# Patient Record
Sex: Female | Born: 1955 | Race: White | Hispanic: No | Marital: Single | Smoking: Current every day smoker
Health system: Southern US, Community
[De-identification: ages and names within clinical notes are randomized; demographics above are authoritative.]

## PROBLEM LIST (undated history)

## (undated) DIAGNOSIS — Z8669 Personal history of other diseases of the nervous system and sense organs: Secondary | ICD-10-CM

## (undated) DIAGNOSIS — F32A Depression, unspecified: Secondary | ICD-10-CM

## (undated) DIAGNOSIS — M6281 Muscle weakness (generalized): Secondary | ICD-10-CM

## (undated) DIAGNOSIS — R0902 Hypoxemia: Secondary | ICD-10-CM

## (undated) DIAGNOSIS — R296 Repeated falls: Secondary | ICD-10-CM

## (undated) DIAGNOSIS — F17200 Nicotine dependence, unspecified, uncomplicated: Secondary | ICD-10-CM

## (undated) DIAGNOSIS — M1990 Unspecified osteoarthritis, unspecified site: Secondary | ICD-10-CM

## (undated) DIAGNOSIS — E669 Obesity, unspecified: Secondary | ICD-10-CM

## (undated) DIAGNOSIS — I10 Essential (primary) hypertension: Secondary | ICD-10-CM

## (undated) DIAGNOSIS — S72041A Displaced fracture of base of neck of right femur, initial encounter for closed fracture: Secondary | ICD-10-CM

## (undated) DIAGNOSIS — E119 Type 2 diabetes mellitus without complications: Secondary | ICD-10-CM

## (undated) DIAGNOSIS — J449 Chronic obstructive pulmonary disease, unspecified: Secondary | ICD-10-CM

## (undated) DIAGNOSIS — W19XXXA Unspecified fall, initial encounter: Secondary | ICD-10-CM

## (undated) DIAGNOSIS — Z9181 History of falling: Secondary | ICD-10-CM

## (undated) DIAGNOSIS — R569 Unspecified convulsions: Secondary | ICD-10-CM

## (undated) HISTORY — DX: Repeated falls: R29.6

## (undated) HISTORY — DX: Unspecified fall, initial encounter: W19.XXXA

## (undated) HISTORY — DX: Unspecified osteoarthritis, unspecified site: M19.90

## (undated) HISTORY — DX: Hypoxemia: R09.02

## (undated) HISTORY — PX: TONSILLECTOMY: SUR1361

## (undated) HISTORY — DX: Personal history of other diseases of the nervous system and sense organs: Z86.69

## (undated) HISTORY — DX: Unspecified convulsions: R56.9

## (undated) HISTORY — DX: Essential (primary) hypertension: I10

## (undated) HISTORY — DX: Depression, unspecified: F32.A

## (undated) HISTORY — DX: Chronic obstructive pulmonary disease, unspecified: J44.9

---

## 2010-06-18 HISTORY — PX: COLON SURGERY: SHX602

## 2010-06-18 HISTORY — PX: HIP SURGERY: SHX245

## 2010-06-18 HISTORY — PX: FRACTURE SURGERY: SHX138

## 2011-02-17 DIAGNOSIS — C189 Malignant neoplasm of colon, unspecified: Secondary | ICD-10-CM

## 2011-02-17 HISTORY — DX: Malignant neoplasm of colon, unspecified: C18.9

## 2016-02-08 HISTORY — PX: COLONOSCOPY: SHX174

## 2016-03-28 ENCOUNTER — Inpatient Hospital Stay (HOSPITAL_COMMUNITY)
Admission: EM | Admit: 2016-03-28 | Discharge: 2016-04-02 | DRG: 536 | Disposition: A | Discharge status: Skilled Nursing Facility | Payer: Medicaid Other | Attending: Internal Medicine | Admitting: Internal Medicine

## 2016-03-28 ENCOUNTER — Emergency Department (HOSPITAL_COMMUNITY): Payer: Medicaid Other

## 2016-03-28 ENCOUNTER — Encounter (HOSPITAL_COMMUNITY): Payer: Self-pay

## 2016-03-28 DIAGNOSIS — Z23 Encounter for immunization: Secondary | ICD-10-CM

## 2016-03-28 DIAGNOSIS — E1169 Type 2 diabetes mellitus with other specified complication: Secondary | ICD-10-CM | POA: Diagnosis present

## 2016-03-28 DIAGNOSIS — S72031A Displaced midcervical fracture of right femur, initial encounter for closed fracture: Principal | ICD-10-CM | POA: Diagnosis present

## 2016-03-28 DIAGNOSIS — Y92000 Kitchen of unspecified non-institutional (private) residence as  the place of occurrence of the external cause: Secondary | ICD-10-CM

## 2016-03-28 DIAGNOSIS — E669 Obesity, unspecified: Secondary | ICD-10-CM | POA: Diagnosis present

## 2016-03-28 DIAGNOSIS — Z6832 Body mass index (BMI) 32.0-32.9, adult: Secondary | ICD-10-CM

## 2016-03-28 DIAGNOSIS — D72829 Elevated white blood cell count, unspecified: Secondary | ICD-10-CM | POA: Diagnosis present

## 2016-03-28 DIAGNOSIS — W010XXA Fall on same level from slipping, tripping and stumbling without subsequent striking against object, initial encounter: Secondary | ICD-10-CM | POA: Diagnosis present

## 2016-03-28 DIAGNOSIS — E119 Type 2 diabetes mellitus without complications: Secondary | ICD-10-CM | POA: Diagnosis present

## 2016-03-28 DIAGNOSIS — F17210 Nicotine dependence, cigarettes, uncomplicated: Secondary | ICD-10-CM | POA: Diagnosis present

## 2016-03-28 DIAGNOSIS — Z833 Family history of diabetes mellitus: Secondary | ICD-10-CM

## 2016-03-28 DIAGNOSIS — S72009A Fracture of unspecified part of neck of unspecified femur, initial encounter for closed fracture: Secondary | ICD-10-CM | POA: Diagnosis present

## 2016-03-28 DIAGNOSIS — R112 Nausea with vomiting, unspecified: Secondary | ICD-10-CM

## 2016-03-28 DIAGNOSIS — S72001A Fracture of unspecified part of neck of right femur, initial encounter for closed fracture: Secondary | ICD-10-CM

## 2016-03-28 HISTORY — DX: Type 2 diabetes mellitus without complications: E11.9

## 2016-03-28 NOTE — ED Provider Notes (Signed)
[facility] DEPT Provider Note   CSN: [id number] Arrival date & time: 03/28/16  2118     History   Chief Complaint Chief Complaint  Patient presents with  . Fall    HPI Danielle Sullivan is a 60 y.o. female.  Patient presents to the emergency department with chief complaint of right hip pain. She states that she lost her balance this afternoon and fell in her kitchen. She complains of constant severe right hip pain. She reports a history of right hip fracture and surgery.  She states that she thinks the surgery was in [place] by a Dr. Lyndel Safe.  She denies any numbness or tingling. She states that her right lower extremity is painful with movement and palpation. She was given 50 g of fentanyl by EMS. States that her pain has improved. There are no other associated symptoms.    The history is provided by the patient. No language interpreter was used.    Past Medical History:  Diagnosis Date  . Diabetes mellitus without complication ([place])     There are no active problems to display for this patient.   Past Surgical History:  Procedure Laterality Date  . FRACTURE SURGERY      OB History    No data available       Home Medications    Prior to Admission medications   Medication Sig Start Date End Date Taking? Authorizing Provider  Ascorbic Acid (VITA-C PO) Take 1 tablet by mouth daily.   Yes Historical Provider, MD  MAGNESIUM PO Take 1 tablet by mouth daily.   Yes Historical Provider, MD  Multiple Minerals-Vitamins (NUTRA-SUPPORT BONE) CAPS Take 1 tablet by mouth daily.   Yes Historical Provider, MD  multivitamin-lutein (OCUVITE-LUTEIN) CAPS capsule Take 1 capsule by mouth daily.   Yes Historical Provider, MD  VITAMIN A PO Take 1 tablet by mouth daily.   Yes Historical Provider, MD  VITAMIN E PO Take 1 tablet by mouth daily.   Yes Historical Provider, MD    Family History History reviewed. No pertinent family history.  Social History Social History  Substance Use  Topics  . Smoking status: Current Every Day Smoker    Packs/day: 1.00    Types: Cigarettes  . Smokeless tobacco: Not on file  . Alcohol use No     Allergies   Pine   Review of Systems Review of Systems  Musculoskeletal: Positive for arthralgias.  All other systems reviewed and are negative.    Physical Exam Updated Vital Signs BP 143/76 (BP Location: Right Arm)   Pulse 88   Temp 98.3 F (36.8 C) (Oral)   Resp 14   Ht 5\' 1"  (1.549 m)   Wt 77.1 kg   SpO2 97%   BMI 32.12 kg/m   Physical Exam Physical Exam  Constitutional: Pt appears well-developed and well-nourished. No distress.  HENT:  Head: Normocephalic and atraumatic.  Eyes: Conjunctivae are normal.  Neck: Normal range of motion.  Cardiovascular: Normal rate, regular rhythm and intact distal pulses.   Capillary refill < 3 sec  Pulmonary/Chest: Effort normal and breath sounds normal.  Musculoskeletal:  Right hip: Pt exhibits tenderness to palpation of right hip, with obvious shortening and external rotation. Pt exhibits no edema.  ROM: limited 2/2 pain  Neurological: Pt  is alert. Coordination normal.  Sensation 5/5 Strength limited 2/2 pain  Skin: Skin is warm and dry. Pt is not diaphoretic.  No tenting of the skin  Psychiatric: Pt has a normal mood and affect.  Nursing note and vitals reviewed.   ED Treatments / Results  Labs (all labs ordered are listed, but only abnormal results are displayed) Labs Reviewed - No data to display  EKG  EKG Interpretation None       Radiology Dg Hip Unilat  With Pelvis 2-3 Views Right  Result Date: 03/28/2016 CLINICAL DATA:  Status post fall with right hip pain. EXAM: DG HIP (WITH OR WITHOUT PELVIS) 2-3V RIGHT COMPARISON:  None. FINDINGS: There is a right femoral nail and proximal compression screw. There is a fracture of the compression screw and transcervical fracture of the cervical neck with mild superior displacement of the femoral shaft in coxa vera  angulation. Addition, there is a irregularity within the left superior pubic ramus which may represent a nondisplaced fracture. No other fracture is identified. IMPRESSION: Right proximal femur transcervical neck fracture involving bone and compression screw with mild superior displacement of the femoral shaft and coxa vera angulation. Possible nondisplaced fracture of left superior pubic ramus. Electronically Signed   By: Kristine Garbe M.D.   On: 03/28/2016 22:58    Procedures Procedures (including critical care time)  Medications Ordered in ED Medications - No data to display   Initial Impression / Assessment and Plan / ED Course  I have reviewed the triage vital signs and the nursing notes.  Pertinent labs & imaging results that were available during my care of the patient were reviewed by me and considered in my medical decision making (see chart for details).  Clinical Course    Patient with right hip pain.  Plain films as above.  Will consult orthopedics.   Discussed the patient's case with Dr. Ninfa Linden, who recommends medicine admission. Dr. Ninfa Linden states that he is concerned that the hardware fracture is not new, but could be chronic.  Agrees that the pubic ramus fracture could be what is keeping the patient from walking.  He states that either way it will be a very complex surgery, and the patient will not have surgery tonight.  Appreciate Dr. Hal Hope for admitting the patient. He requests that I order a urinalysis and EKG for preoperative workup.  Final Clinical Impressions(s) / ED Diagnoses   Final diagnoses:  Closed fracture of right hip, initial encounter [facility])    New Prescriptions New Prescriptions   No medications on file     Montine Circle, PA-C 03/29/16 [place], DO 03/29/16 0330

## 2016-03-28 NOTE — ED Triage Notes (Signed)
Per [facility], pt from home. Pt fell in the kitchen around 1600 this afternoon. Pt did hit head but no LOC. Pt has small abrasion on right shoulder. Pt has shortening and rotation of right leg. Good pedal pulse and sensation and has movement of toes. Pt given 15mcg of fentanyl in route, pain now a 7/10 but spo2 dropped to 88%. VSS. Pt alert and oriented x4. CBG 296, pt is insulin dependent diabetic. NSR.

## 2016-03-29 ENCOUNTER — Encounter (HOSPITAL_COMMUNITY): Payer: Self-pay | Admitting: Internal Medicine

## 2016-03-29 ENCOUNTER — Emergency Department (HOSPITAL_COMMUNITY): Payer: Medicaid Other

## 2016-03-29 DIAGNOSIS — S72031A Displaced midcervical fracture of right femur, initial encounter for closed fracture: Secondary | ICD-10-CM | POA: Diagnosis not present

## 2016-03-29 DIAGNOSIS — W010XXA Fall on same level from slipping, tripping and stumbling without subsequent striking against object, initial encounter: Secondary | ICD-10-CM | POA: Diagnosis present

## 2016-03-29 DIAGNOSIS — Z6832 Body mass index (BMI) 32.0-32.9, adult: Secondary | ICD-10-CM | POA: Diagnosis not present

## 2016-03-29 DIAGNOSIS — Z833 Family history of diabetes mellitus: Secondary | ICD-10-CM | POA: Diagnosis not present

## 2016-03-29 DIAGNOSIS — S72009A Fracture of unspecified part of neck of unspecified femur, initial encounter for closed fracture: Secondary | ICD-10-CM | POA: Diagnosis present

## 2016-03-29 DIAGNOSIS — E669 Obesity, unspecified: Secondary | ICD-10-CM

## 2016-03-29 DIAGNOSIS — S72041A Displaced fracture of base of neck of right femur, initial encounter for closed fracture: Secondary | ICD-10-CM | POA: Diagnosis not present

## 2016-03-29 DIAGNOSIS — S72001A Fracture of unspecified part of neck of right femur, initial encounter for closed fracture: Secondary | ICD-10-CM

## 2016-03-29 DIAGNOSIS — E119 Type 2 diabetes mellitus without complications: Secondary | ICD-10-CM | POA: Diagnosis present

## 2016-03-29 DIAGNOSIS — D72829 Elevated white blood cell count, unspecified: Secondary | ICD-10-CM | POA: Diagnosis present

## 2016-03-29 DIAGNOSIS — Y92000 Kitchen of unspecified non-institutional (private) residence as  the place of occurrence of the external cause: Secondary | ICD-10-CM | POA: Diagnosis not present

## 2016-03-29 DIAGNOSIS — Z23 Encounter for immunization: Secondary | ICD-10-CM | POA: Diagnosis not present

## 2016-03-29 DIAGNOSIS — E1169 Type 2 diabetes mellitus with other specified complication: Secondary | ICD-10-CM | POA: Diagnosis not present

## 2016-03-29 DIAGNOSIS — F17210 Nicotine dependence, cigarettes, uncomplicated: Secondary | ICD-10-CM | POA: Diagnosis present

## 2016-03-29 DIAGNOSIS — M25551 Pain in right hip: Secondary | ICD-10-CM | POA: Diagnosis not present

## 2016-03-29 LAB — CBC
HCT: 41.4 % (ref 36.0–46.0)
Hemoglobin: 13.4 g/dL (ref 12.0–15.0)
MCH: 29.5 pg (ref 26.0–34.0)
MCHC: 32.4 g/dL (ref 30.0–36.0)
MCV: 91.2 fL (ref 78.0–100.0)
PLATELETS: 338 10*3/uL (ref 150–400)
RBC: 4.54 MIL/uL (ref 3.87–5.11)
RDW: 13.7 % (ref 11.5–15.5)
WBC: 16.2 10*3/uL — ABNORMAL HIGH (ref 4.0–10.5)

## 2016-03-29 LAB — BASIC METABOLIC PANEL
ANION GAP: 9 (ref 5–15)
Anion gap: 9 (ref 5–15)
BUN: 19 mg/dL (ref 6–20)
BUN: 16 mg/dL (ref 6–20)
CALCIUM: 9.4 mg/dL (ref 8.9–10.3)
CALCIUM: 9 mg/dL (ref 8.9–10.3)
CO2: 27 mmol/L (ref 22–32)
CO2: 26 mmol/L (ref 22–32)
CREATININE: 0.67 mg/dL (ref 0.44–1.00)
CREATININE: 0.57 mg/dL (ref 0.44–1.00)
Chloride: 100 mmol/L — ABNORMAL LOW (ref 101–111)
Chloride: 101 mmol/L (ref 101–111)
GFR calc Af Amer: >60 mL/min (ref >60)
GFR calc Af Amer: >60 mL/min (ref >60)
GFR calc non Af Amer: >60 mL/min (ref >60)
GLUCOSE: 194 mg/dL — AB (ref 65–99)
GLUCOSE: 155 mg/dL — AB (ref 65–99)
POTASSIUM: 4 mmol/L (ref 3.5–5.1)
Potassium: 5 mmol/L (ref 3.5–5.1)
SODIUM: 136 mmol/L (ref 135–145)
Sodium: 136 mmol/L (ref 135–145)

## 2016-03-29 LAB — CBC WITH DIFFERENTIAL/PLATELET
Basophils Absolute: 0 10*3/uL (ref 0.0–0.1)
Basophils Relative: 0 %
Eosinophils Absolute: 0 10*3/uL (ref 0.0–0.7)
Eosinophils Relative: 0 %
HEMATOCRIT: 43.2 % (ref 36.0–46.0)
Hemoglobin: 14.3 g/dL (ref 12.0–15.0)
LYMPHS PCT: 10 %
Lymphs Abs: 1.8 10*3/uL (ref 0.7–4.0)
MCH: 30.2 pg (ref 26.0–34.0)
MCHC: 33.1 g/dL (ref 30.0–36.0)
MCV: 91.3 fL (ref 78.0–100.0)
MONO ABS: 0.8 10*3/uL (ref 0.1–1.0)
MONOS PCT: 4 %
NEUTROS ABS: 15.4 10*3/uL — AB (ref 1.7–7.7)
Neutrophils Relative %: 86 %
Platelets: 324 10*3/uL (ref 150–400)
RBC: 4.73 MIL/uL (ref 3.87–5.11)
RDW: 13.6 % (ref 11.5–15.5)
WBC: 18 10*3/uL — ABNORMAL HIGH (ref 4.0–10.5)

## 2016-03-29 LAB — URINE MICROSCOPIC-ADD ON: WBC UA: NONE SEEN WBC/hpf (ref 0–5)

## 2016-03-29 LAB — URINALYSIS, ROUTINE W REFLEX MICROSCOPIC
Bilirubin Urine: NEGATIVE
GLUCOSE, UA: 250 mg/dL — AB
KETONES UR: NEGATIVE mg/dL
LEUKOCYTES UA: NEGATIVE
Nitrite: NEGATIVE
PH: 5.5 (ref 5.0–8.0)
Protein, ur: NEGATIVE mg/dL
SPECIFIC GRAVITY, URINE: 1.022 (ref 1.005–1.030)

## 2016-03-29 LAB — GLUCOSE, CAPILLARY
GLUCOSE-CAPILLARY: 166 mg/dL — AB (ref 65–99)
Glucose-Capillary: 236 mg/dL — ABNORMAL HIGH (ref 65–99)
Glucose-Capillary: 210 mg/dL — ABNORMAL HIGH (ref 65–99)
Glucose-Capillary: 191 mg/dL — ABNORMAL HIGH (ref 65–99)

## 2016-03-29 LAB — ABO/RH: ABO/RH(D): B POS

## 2016-03-29 LAB — TYPE AND SCREEN
ABO/RH(D): B POS
ANTIBODY SCREEN: NEGATIVE

## 2016-03-29 MED ORDER — INSULIN GLARGINE 100 UNIT/ML ~~LOC~~ SOLN
10.0000 [IU] | Freq: Every day | SUBCUTANEOUS | Status: DC
  Administered 2016-03-30 – 2016-04-01 (×3): 10 [IU] via SUBCUTANEOUS
  Filled 2016-03-29 (×4): qty 0.1

## 2016-03-29 MED ORDER — INSULIN GLARGINE 100 UNIT/ML ~~LOC~~ SOLN
16.0000 [IU] | Freq: Every day | SUBCUTANEOUS | Status: DC
  Filled 2016-03-29 (×2): qty 0.16

## 2016-03-29 MED ORDER — POLYETHYLENE GLYCOL 3350 17 G PO PACK
17.0000 g | PACK | Freq: Every day | ORAL | Status: DC
  Administered 2016-03-29 – 2016-04-02 (×5): 17 g via ORAL
  Filled 2016-03-29 (×5): qty 1

## 2016-03-29 MED ORDER — SENNA 8.6 MG PO TABS
1.0000 | ORAL_TABLET | Freq: Every day | ORAL | Status: DC
  Administered 2016-03-29 – 2016-04-02 (×5): 8.6 mg via ORAL
  Filled 2016-03-29 (×5): qty 1

## 2016-03-29 MED ORDER — FENTANYL CITRATE (PF) 100 MCG/2ML IJ SOLN
50.0000 ug | Freq: Once | INTRAMUSCULAR | Status: AC
  Administered 2016-03-29: 50 ug via INTRAVENOUS
  Filled 2016-03-29: qty 2

## 2016-03-29 MED ORDER — INSULIN ASPART 100 UNIT/ML ~~LOC~~ SOLN
0.0000 [IU] | Freq: Three times a day (TID) | SUBCUTANEOUS | Status: DC
  Administered 2016-03-29: 3 [IU] via SUBCUTANEOUS
  Administered 2016-03-29: 2 [IU] via SUBCUTANEOUS
  Administered 2016-03-30 (×2): 5 [IU] via SUBCUTANEOUS
  Administered 2016-03-30: 1 [IU] via SUBCUTANEOUS
  Administered 2016-03-31: 2 [IU] via SUBCUTANEOUS
  Administered 2016-03-31: 1 [IU] via SUBCUTANEOUS
  Administered 2016-04-01 (×2): 3 [IU] via SUBCUTANEOUS
  Administered 2016-04-01 – 2016-04-02 (×2): 2 [IU] via SUBCUTANEOUS
  Administered 2016-04-02: 5 [IU] via SUBCUTANEOUS

## 2016-03-29 MED ORDER — ENSURE ENLIVE PO LIQD: 237.0000 mL | Freq: Three times a day (TID) | ORAL | Status: DC

## 2016-03-29 MED ORDER — ONDANSETRON HCL 4 MG/2ML IJ SOLN
4.0000 mg | Freq: Four times a day (QID) | INTRAMUSCULAR | Status: DC
  Administered 2016-03-29: 4 mg via INTRAVENOUS
  Filled 2016-03-29: qty 2

## 2016-03-29 MED ORDER — ONDANSETRON HCL 4 MG/2ML IJ SOLN: 4.0000 mg | Freq: Four times a day (QID) | INTRAMUSCULAR | Status: DC | PRN

## 2016-03-29 MED ORDER — MORPHINE SULFATE (PF) 2 MG/ML IV SOLN
0.5000 mg | INTRAVENOUS | Status: DC | PRN
  Administered 2016-03-29 (×3): 0.5 mg via INTRAVENOUS
  Filled 2016-03-29 (×3): qty 1

## 2016-03-29 MED ORDER — INFLUENZA VAC SPLIT QUAD 0.5 ML IM SUSY
0.5000 mL | PREFILLED_SYRINGE | INTRAMUSCULAR | Status: AC
  Administered 2016-03-30: 0.5 mL via INTRAMUSCULAR
  Filled 2016-03-29: qty 0.5

## 2016-03-29 MED ORDER — HEPARIN SODIUM (PORCINE) 5000 UNIT/ML IJ SOLN
5000.0000 [IU] | Freq: Three times a day (TID) | INTRAMUSCULAR | Status: DC
  Administered 2016-03-29 – 2016-04-02 (×12): 5000 [IU] via SUBCUTANEOUS
  Filled 2016-03-29 (×12): qty 1

## 2016-03-29 MED ORDER — HYDROCODONE-ACETAMINOPHEN 5-325 MG PO TABS
1.0000 | ORAL_TABLET | Freq: Four times a day (QID) | ORAL | Status: DC | PRN
  Administered 2016-03-29: 2 via ORAL
  Administered 2016-03-29 – 2016-03-30 (×2): 1 via ORAL
  Administered 2016-03-30: 2 via ORAL
  Administered 2016-04-02: 1 via ORAL
  Filled 2016-03-29: qty 2
  Filled 2016-03-29 (×3): qty 1
  Filled 2016-03-29: qty 2

## 2016-03-29 MED ORDER — GLUCERNA SHAKE PO LIQD
237.0000 mL | Freq: Three times a day (TID) | ORAL | Status: DC
  Administered 2016-03-29 – 2016-04-02 (×10): 237 mL via ORAL

## 2016-03-29 NOTE — Progress Notes (Signed)
Patient ID: Danielle Sullivan, female   DOB: 01-22-1956, 60 y.o.   MRN: [id number] I spoke with her sister in [place] and apparently the right hip was originally fixed in [place] sometime around 2010 - 2012.  I told her sister to try to find out which MD/hospital so we can find out more info about what hardware is in her.  I also told her that surgery would be set up in a few weeks given the complexity of her fracture and that we will order PT and OT as well as a Social Work consult to address the need for skilled nursing facility placement prior to setting up her definitive surgery.

## 2016-03-29 NOTE — Plan of Care (Signed)
Problem: Safety: Goal: Ability to remain free from injury will improve Outcome: Progressing Safety precautions maintained   

## 2016-03-29 NOTE — H&P (Signed)
History and Physical    Danielle Sullivan [id number] DOB: 1956/03/15 DOA: 03/28/2016  PCP: Pcp Not In System  Patient coming from: Home.  Chief Complaint: Fall.  HPI: Danielle Sullivan is a 60 y.o. female with history of diabetes mellitus and tobacco abuse had a fall last evening at home while walking in the kitchen. Patient states she lost balance and fell. Denies hitting her head or losing consciousness. She did bruise her right shoulder. X-rays in the ER shows right hip fracture and on-call orthopedic surgeon Dr. Ninfa Linden was consulted. Patient is being admitted for further management of right hip fracture. Denies any chest pain or shortness of breath.   ED Course: X-ray shows right hip fracture.  Review of Systems: As per HPI, rest all negative.   Past Medical History:  Diagnosis Date  . Diabetes mellitus without complication [facility])     Past Surgical History:  Procedure Laterality Date  . FRACTURE SURGERY       reports that she has been smoking Cigarettes.  She has been smoking about 1.00 pack per day. She has never used smokeless tobacco. She reports that she does not drink alcohol or use drugs.  Allergies  Allergen Reactions  . Pine Rash    Family History  Problem Relation Age of Onset  . Diabetes Sister     Prior to Admission medications   Medication Sig Start Date End Date Taking? Authorizing Provider  Ascorbic Acid (VITA-C PO) Take 1 tablet by mouth daily.   Yes Historical Provider, MD  MAGNESIUM PO Take 1 tablet by mouth daily.   Yes Historical Provider, MD  Multiple Minerals-Vitamins (NUTRA-SUPPORT BONE) CAPS Take 1 tablet by mouth daily.   Yes Historical Provider, MD  multivitamin-lutein (OCUVITE-LUTEIN) CAPS capsule Take 1 capsule by mouth daily.   Yes Historical Provider, MD  VITAMIN A PO Take 1 tablet by mouth daily.   Yes Historical Provider, MD  VITAMIN E PO Take 1 tablet by mouth daily.   Yes Historical Provider, MD    Physical Exam: Vitals:   03/29/16  0030 03/29/16 0045 03/29/16 0200 03/29/16 0230  BP: 132/65 131/63 121/67 135/66  Pulse: 104 105 103 102  Resp:    19  Temp:      TempSrc:      SpO2: 95% 92% 92% 93%  Weight:      Height:          Constitutional: Moderately built and nourished. Vitals:   03/29/16 0030 03/29/16 0045 03/29/16 0200 03/29/16 0230  BP: 132/65 131/63 121/67 135/66  Pulse: 104 105 103 102  Resp:    19  Temp:      TempSrc:      SpO2: 95% 92% 92% 93%  Weight:      Height:       Eyes: Anicteric no pallor. ENMT: No discharge from the ears eyes nose and mouth. Neck: No JVD appreciated no mass felt. Respiratory: No rhonchi or crepitations. Cardiovascular: S1-S2 heard. No murmur appreciated. Abdomen: Soft nontender bowel sounds present. Musculoskeletal: Pain on moving right hip. Skin: No rash as a skin bruise on the right shoulder. Neurologic: Alert awake oriented to time place and person. Moves all extremities. Psychiatric: Appears normal normal affect.   Labs on Admission: I have personally reviewed following labs and imaging studies  CBC:  Recent Labs Lab 03/28/16 2322  WBC 18.0*  NEUTROABS 15.4*  HGB 14.3  HCT 43.2  MCV 91.3  PLT 0000000   Basic Metabolic Panel:  Recent Labs  Lab 03/28/16 2322  NA 136  K 5.0  CL 100*  CO2 27  GLUCOSE 194*  BUN 19  CREATININE 0.67  CALCIUM 9.4   GFR: Estimated Creatinine Clearance: 68.5 mL/min (by C-G formula based on SCr of 0.67 mg/dL). Liver Function Tests: No results for input(s): AST, ALT, ALKPHOS, BILITOT, PROT, ALBUMIN in the last 168 hours. No results for input(s): LIPASE, AMYLASE in the last 168 hours. No results for input(s): AMMONIA in the last 168 hours. Coagulation Profile: No results for input(s): INR, PROTIME in the last 168 hours. Cardiac Enzymes: No results for input(s): CKTOTAL, CKMB, CKMBINDEX, TROPONINI in the last 168 hours. BNP (last 3 results) No results for input(s): PROBNP in the last 8760 hours. HbA1C: No results  for input(s): HGBA1C in the last 72 hours. CBG: No results for input(s): GLUCAP in the last 168 hours. Lipid Profile: No results for input(s): CHOL, HDL, LDLCALC, TRIG, CHOLHDL, LDLDIRECT in the last 72 hours. Thyroid Function Tests: No results for input(s): TSH, T4TOTAL, FREET4, T3FREE, THYROIDAB in the last 72 hours. Anemia Panel: No results for input(s): VITAMINB12, FOLATE, FERRITIN, TIBC, IRON, RETICCTPCT in the last 72 hours. Urine analysis: No results found for: COLORURINE, APPEARANCEUR, LABSPEC, PHURINE, GLUCOSEU, HGBUR, BILIRUBINUR, KETONESUR, PROTEINUR, UROBILINOGEN, NITRITE, LEUKOCYTESUR Sepsis Labs: @LABRCNTIP (procalcitonin:4,lacticidven:4) )No results found for this or any previous visit (from the past 240 hour(s)).   Radiological Exams on Admission: Dg Chest 1 View  Result Date: 03/28/2016 CLINICAL DATA:  Acute onset of shortness of breath. Preoperative radiograph at the right hip. Initial encounter. EXAM: CHEST 1 VIEW COMPARISON:  None. FINDINGS: The lungs are well-aerated. Mild vascular congestion is noted. Mildly increased interstitial markings may reflect mild interstitial edema. There is no evidence of pleural effusion or pneumothorax. The cardiomediastinal silhouette is borderline normal in size. No acute osseous abnormalities are seen. IMPRESSION: Mild vascular congestion noted. Mildly increased interstitial markings may reflect mild interstitial edema. Electronically Signed   By: Garald Balding M.D.   On: 03/28/2016 23:17   Ct Hip Right Wo Contrast  Result Date: 03/29/2016 CLINICAL DATA:  Status post fall in kitchen, with right hip pain. Initial encounter. EXAM: CT OF THE RIGHT HIP WITHOUT CONTRAST TECHNIQUE: Multidetector CT imaging of the right hip was performed according to the standard protocol. Multiplanar CT image reconstructions were also generated. COMPARISON:  None. FINDINGS: Bones/Joint/Cartilage There is a screw fracture through the medial aspect of the  patient's right femoral neck screw, with an associated transcervical fracture through the right femoral neck. The medial portion of the screw remains at the right femoral head. There is superior displacement of the distal femur. There is also loosening involving the distal aspect of the patient's intramedullary nail, measuring up to 5 mm medially. There appears to be a chronically displaced greater trochanteric fragment posterior and medial to the proximal femur. The right femoral head remains seated at the acetabulum. A long osseous fragment is noted inferior to the right femoral neck, of uncertain significance. This may reflect heterotopic bone formation. Ligaments Suboptimally assessed by CT. Muscles and Tendons The visualized musculature is grossly unremarkable in appearance, aside from mild atrophy at the proximal right hamstring. No definite tendon abnormalities are characterized. Soft tissues Scattered vascular calcifications are seen. A small right inguinal hernia is noted, containing only fat. The bladder is moderately distended and grossly unremarkable. Visualized small and large bowel loops are grossly unremarkable. IMPRESSION: 1. Screw fracture through the medial aspect of the right femoral neck screw, with an associated transcervical fracture through  the right femoral neck. The medial portion of the screw remains at the right femoral head. Superior displacement of the distal femur. 2. Loosening involving the distal aspect of the right femoral intramedullary nail, measuring up to 5 mm medially. 3. Chronically displaced greater trochanteric fragment noted posterior and medial to the proximal femur. Long osseous fragment inferior to the right femoral neck may reflect heterotopic bone formation. 4. Small right inguinal hernia, containing only fat. 5. Scattered vascular calcifications seen. Electronically Signed   By: Garald Balding M.D.   On: 03/29/2016 02:15   Dg Hip Unilat  With Pelvis 2-3 Views  Right  Result Date: 03/28/2016 CLINICAL DATA:  Status post fall with right hip pain. EXAM: DG HIP (WITH OR WITHOUT PELVIS) 2-3V RIGHT COMPARISON:  None. FINDINGS: There is a right femoral nail and proximal compression screw. There is a fracture of the compression screw and transcervical fracture of the cervical neck with mild superior displacement of the femoral shaft in coxa vera angulation. Addition, there is a irregularity within the left superior pubic ramus which may represent a nondisplaced fracture. No other fracture is identified. IMPRESSION: Right proximal femur transcervical neck fracture involving bone and compression screw with mild superior displacement of the femoral shaft and coxa vera angulation. Possible nondisplaced fracture of left superior pubic ramus. Electronically Signed   By: Kristine Garbe M.D.   On: 03/28/2016 22:58     Assessment/Plan Principal Problem:   Hip fracture (HCC) Active Problems:   Diabetes mellitus type 2 in obese ([place])    1. Right hip fracture status post mechanical fall - patient is at moderate risk for intermediate risk procedure. Orthopedic surgery planning surgery probably a day later. 2. Diabetes mellitus type 2 - patient takes Lantus 16 units at bedtime. Will keep patient also in addition on sliding scale coverage. On the day of surgery will decrease the dose of Lantus by half. 3. Leukocytosis - probably reactionary. UA is pending. Patient is afebrile. 4. Tobacco abuse - tobacco cessation counseling requested.   DVT prophylaxis: SCDs. Code Status: Full code.  Family Communication: Discussed with patient.  Disposition Plan: To be determined.  Consults called: Orthopedic surgery.  Admission status: Inpatient. Likely stay 4 days.    Rise Patience MD [facility] Pager [phone].  If 7PM-7AM, please contact night-coverage [email or web address] Password [email or web address]  03/29/2016, 2:58 AM

## 2016-03-29 NOTE — Progress Notes (Signed)
Patient ID: Danielle Sullivan, female   DOB: 09-Feb-1956, 60 y.o.   MRN: [id number] I reviewed her right hip CT scan again with radiology and they feel as I do that this is a subacute situation with her right hip.  She needs to remain non-weight bearing on that hip for now and needs PT and OT.  She does need surgery in the future for removal of her hardware and conversion to a right total hip replacement.  However, this will not be done during this hospitalization.  I need to find out more information from her Orthopedic surgeon in [place] as to what the hardware is in her hip in order to be able to remove that hardware and then replace her hip.  This surgery will be quite an undertaking and will need to be scheduled sometime in the next few weeks due to the complicated nature of her situation.  This is something that does not need to be done in the evenings or weekend.  I suspect that the hardware has been broken for sometime as I've talked with her further and that things have slowly worsened with time and then got acutely worse with her fall yesterday.

## 2016-03-29 NOTE — Plan of Care (Signed)
Problem: Pain Management: Goal: Pain level will decrease Outcome: Progressing Medicated for pain

## 2016-03-29 NOTE — Evaluation (Signed)
Physical Therapy Evaluation Patient Details Name: Danielle Sullivan MRN: [id number] DOB: Jun 10, 1956 Today's Date: 03/29/2016   History of Present Illness  Pt is a 60 y/o female admitted following a fall at home, found to have an old R hip fx with failure of hardware. PMH including but not limited to DM and hx of hip fx.  Clinical Impression  Pt presented supine in bed with HOB elevated, awake and willing to participate in therapy session. Prior to admission, pt stated that she ambulated with use of quad cane and was independent with all ADL's. Pt moving fairly well during session, but needed mod A to achieve sitting EOB. To perform STS and SPT she needed min guard for safety only. PT recommending pt d/c to home with 24 hour supervision/assistance. However, pt does not currently have anyone that can be with her 24 hours/day. Pt needs transfer training with sister (April) prior to d/c. Pt would continue to benefit from skilled physical therapy services at this time while admitted and after d/c to address her below listed limitations in order to improve her overall safety and independence with functional mobility.     Follow Up Recommendations Home health PT;Supervision/Assistance - 24 hour    Equipment Recommendations  3in1 (PT);Wheelchair (measurements PT);Wheelchair cushion (measurements PT);Other (comment) (w/c with elevating leg rests)    Recommendations for Other Services OT consult     Precautions / Restrictions Precautions Precautions: Fall Restrictions Weight Bearing Restrictions: Yes RLE Weight Bearing: Non weight bearing      Mobility  Bed Mobility Overal bed mobility: Needs Assistance Bed Mobility: Supine to Sit     Supine to sit: HOB elevated;Min assist     General bed mobility comments: pt required increased time, VC'ing for sequencing and min A with use of bed pad to achieve sitting EOB  Transfers Overall transfer level: Needs assistance Equipment used: Rolling walker  (2 wheeled) Transfers: Sit to/from [facility] Sit to Stand: Min guard Stand pivot transfers: Min guard       General transfer comment: pt required increased time, VC'ing for bilateral hand placement and min guard for safety. pt able to maintain NWB R LE throughout transfers.  Ambulation/Gait                Stairs            Wheelchair Mobility    Modified Rankin (Stroke Patients Only)       Balance Overall balance assessment: Needs assistance Sitting-balance support: Feet supported;Bilateral upper extremity supported Sitting balance-Leahy Scale: Poor     Standing balance support: During functional activity;Bilateral upper extremity supported Standing balance-Leahy Scale: Poor Standing balance comment: pt reliant on bilateral UEs on RW                             Pertinent Vitals/Pain Pain Assessment: 0-10 Pain Score: 8  Pain Location: R hip Pain Descriptors / Indicators: Grimacing;Guarding Pain Intervention(s): Monitored during session;Repositioned;Premedicated before session;Patient requesting pain meds-RN notified;RN gave pain meds during session    Home Living Family/patient expects to be discharged to:: Private residence Living Arrangements: Parent;Other relatives Available Help at Discharge: Family;Available PRN/intermittently Type of Home: House Home Access: Stairs to enter Entrance Stairs-Rails: Right Entrance Stairs-Number of Steps: 4 Home Layout: One level Home Equipment: Walker - 2 wheels;Cane - quad;Shower seat      Prior Function Level of Independence: Independent with assistive device(s)         Comments:  pt used quad cane to ambulate with prior to admission     Hand Dominance   Dominant Hand: Right    Extremity/Trunk Assessment   Upper Extremity Assessment: Overall WFL for tasks assessed           Lower Extremity Assessment: RLE deficits/detail RLE Deficits / Details: pt with decreased  strength and ROM limitations secondary to pain. pt now NWB R LE.       Communication   Communication: No difficulties  Cognition Arousal/Alertness: Lethargic;Suspect due to medications Behavior During Therapy: [facility] for tasks assessed/performed Overall Cognitive Status: No family/caregiver present to determine baseline cognitive functioning Area of Impairment: Memory;Following commands;Safety/judgement;Problem solving     Memory: Decreased short-term memory Following Commands: Follows one step commands consistently;Follows multi-step commands inconsistently;Follows one step commands with increased time Safety/Judgement: Decreased awareness of safety   Problem Solving: Slow processing;Decreased initiation;Difficulty sequencing;Requires verbal cues;Requires tactile cues      General Comments      Exercises     Assessment/Plan    PT Assessment Patient needs continued PT services  PT Problem List Decreased strength;Decreased range of motion;Decreased activity tolerance;Decreased balance;Decreased mobility;Decreased coordination;Decreased knowledge of use of DME;Pain          PT Treatment Interventions DME instruction;Gait training;Stair training;Functional mobility training;Therapeutic activities;Therapeutic exercise;Balance training;Neuromuscular re-education;Patient/family education    PT Goals (Current goals can be found in the Care Plan section)  Acute Rehab PT Goals Patient Stated Goal: decrease pain PT Goal Formulation: With patient Time For Goal Achievement: 04/05/16 Potential to Achieve Goals: Good    Frequency Min 5X/week   Barriers to discharge        Co-evaluation               End of Session Equipment Utilized During Treatment: Gait belt Activity Tolerance: Patient limited by pain Patient left: in chair;with call bell/phone within reach;with SCD's reapplied Nurse Communication: Mobility status;Weight bearing status         Time: [id number] PT  Time Calculation (min) (ACUTE ONLY): 47 min   Charges:   PT Evaluation $PT Eval Moderate Complexity: 1 Procedure PT Treatments $Therapeutic Activity: 23-37 mins   PT G CodesClearnce Sorrel Sartaj Hoskin 03/29/2016, 5:45 PM Sherie Don, [place], DPT [phone]

## 2016-03-29 NOTE — Progress Notes (Signed)
Patient ID: Danielle Sullivan, female   DOB: 04/05/1956, 60 y.o.   MRN: [id number] This is quite a complicated situation.  The patient had a right hip fracture around 2012 that was treated in [place].  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  I reviewed the x-rays and the CT scan as well as spoke with the patient in length.  She has actually been limping for some time now and has known her right hip was not doing well for quite some time.  In my opinion, this is actually an old fracture that has had hardware failure for sometime now.  She does not need an urgent surgery.  I will need to contact the surgeon in [place] to see what hardware is in her hip.  Any surgery will be quite complicated in that she will need to have the hardware removed followed by a right total hip replacement.  This does not need to be done today at all. I, again, need to find out details from the [place] MD to get a better idea of truly how long this has been going on.  I believe her fall worsened an already chronic problem.  She can eat today and I will order therapy to get her up.  Will follow-up this evening.

## 2016-03-29 NOTE — Progress Notes (Addendum)
PROGRESS NOTE    Danielle Sullivan  [id number] DOB: 1956/05/18 DOA: 03/28/2016 PCP: Pcp Not In System   Brief Narrative: Danielle Sullivan is a 60 y.o. female with history of diabetes mellitus and tobacco abuse had a fall last evening at home while walking in the kitchen. Patient states she lost balance and fell. Denies hitting her head or losing consciousness. She did bruise her right shoulder. X-rays in the ER shows right hip fracture and on-call orthopedic surgeon Dr. Ninfa Linden was consulted. Patient is being admitted for further management of right hip fracture. Denies any chest pain or shortness of breath.    Assessment & Plan:   Principal Problem:   Hip fracture ([place]) Active Problems:   Diabetes mellitus type 2 in obese [facility])  Right hip fracture status post mechanical fall;  Dr Ninfa Linden consulted, he think fracture is more chronic, failure of hardware.  He will discussed case with patient 's primary orthopedic sx.  Heparin for DVT prophylaxis.  Bowel regimen.  Pain management with fentanyl, Vicodin.   Diabetes mellitus type 2; Lantus 10 units daily   Leukocytosis - probably reactionary. UA negative. Patient is afebrile. Follow trend.  Chest x ray negative for PNA.   Tobacco abuse - tobacco cessation counseling requested.   DVT prophylaxis: heparin  Code Status: full code.  Family Communication: discussed care with patient.  Disposition Plan: remain inpatient for sx.    Consultants:      Procedures: none   Antimicrobials: none   Subjective: Complaining of hip pain.  Denies chest pain.   Objective: Vitals:   03/29/16 0045 03/29/16 0200 03/29/16 0230 03/29/16 0400  BP: 131/63 121/67 135/66 124/61  Pulse: 105 103 102 (!) 102  Resp:   19 18  Temp:    97.9 F (36.6 C)  TempSrc:    Oral  SpO2: 92% 92% 93% 95%  Weight:      Height:       No intake or output data in the 24 hours ending 03/29/16 1121 Filed Weights   03/28/16 2133  Weight: 77.1 kg (170 lb)     Examination:  General exam: Appears calm and comfortable  Respiratory system: Clear to auscultation. Respiratory effort normal. Cardiovascular system: S1 & S2 heard, RRR. No JVD, murmurs, rubs, gallops or clicks. No pedal edema. Gastrointestinal system: Abdomen is nondistended, soft and nontender. No organomegaly or masses felt. Normal bowel sounds heard. Central nervous system: Alert and oriented. No focal neurological deficits. Extremities:  Moves all extremities.  Skin: No rashes, lesions or ulcers Psychiatry: Judgement and insight appear normal. Mood & affect appropriate.     Data Reviewed: I have personally reviewed following labs and imaging studies  CBC:  Recent Labs Lab 03/28/16 2322 03/29/16 0436  WBC 18.0* 16.2*  NEUTROABS 15.4*  --   HGB 14.3 13.4  HCT 43.2 41.4  MCV 91.3 91.2  PLT 324 [id number]   Basic Metabolic Panel:  Recent Labs Lab 03/28/16 2322 03/29/16 0436  NA 136 136  K 5.0 4.0  CL 100* 101  CO2 27 26  GLUCOSE 194* 155*  BUN 19 16  CREATININE 0.67 0.57  CALCIUM 9.4 9.0   GFR: Estimated Creatinine Clearance: 68.5 mL/min (by C-G formula based on SCr of 0.57 mg/dL). Liver Function Tests: No results for input(s): AST, ALT, ALKPHOS, BILITOT, PROT, ALBUMIN in the last 168 hours. No results for input(s): LIPASE, AMYLASE in the last 168 hours. No results for input(s): AMMONIA in the last 168 hours. Coagulation Profile: No results  for input(s): INR, PROTIME in the last 168 hours. Cardiac Enzymes: No results for input(s): CKTOTAL, CKMB, CKMBINDEX, TROPONINI in the last 168 hours. BNP (last 3 results) No results for input(s): PROBNP in the last 8760 hours. HbA1C: No results for input(s): HGBA1C in the last 72 hours. CBG:  Recent Labs Lab 03/29/16 0502  GLUCAP 166*   Lipid Profile: No results for input(s): CHOL, HDL, LDLCALC, TRIG, CHOLHDL, LDLDIRECT in the last 72 hours. Thyroid Function Tests: No results for input(s): TSH, T4TOTAL, FREET4,  T3FREE, THYROIDAB in the last 72 hours. Anemia Panel: No results for input(s): VITAMINB12, FOLATE, FERRITIN, TIBC, IRON, RETICCTPCT in the last 72 hours. Sepsis Labs: No results for input(s): PROCALCITON, LATICACIDVEN in the last 168 hours.  No results found for this or any previous visit (from the past 240 hour(s)).       Radiology Studies: Dg Chest 1 View  Result Date: 03/28/2016 CLINICAL DATA:  Acute onset of shortness of breath. Preoperative radiograph at the right hip. Initial encounter. EXAM: CHEST 1 VIEW COMPARISON:  None. FINDINGS: The lungs are well-aerated. Mild vascular congestion is noted. Mildly increased interstitial markings may reflect mild interstitial edema. There is no evidence of pleural effusion or pneumothorax. The cardiomediastinal silhouette is borderline normal in size. No acute osseous abnormalities are seen. IMPRESSION: Mild vascular congestion noted. Mildly increased interstitial markings may reflect mild interstitial edema. Electronically Signed   By: Garald Balding M.D.   On: 03/28/2016 23:17   Ct Hip Right Wo Contrast  Result Date: 03/29/2016 CLINICAL DATA:  Status post fall in kitchen, with right hip pain. Initial encounter. EXAM: CT OF THE RIGHT HIP WITHOUT CONTRAST TECHNIQUE: Multidetector CT imaging of the right hip was performed according to the standard protocol. Multiplanar CT image reconstructions were also generated. COMPARISON:  None. FINDINGS: Bones/Joint/Cartilage There is a screw fracture through the medial aspect of the patient's right femoral neck screw, with an associated transcervical fracture through the right femoral neck. The medial portion of the screw remains at the right femoral head. There is superior displacement of the distal femur. There is also loosening involving the distal aspect of the patient's intramedullary nail, measuring up to 5 mm medially. There appears to be a chronically displaced greater trochanteric fragment posterior and  medial to the proximal femur. The right femoral head remains seated at the acetabulum. A long osseous fragment is noted inferior to the right femoral neck, of uncertain significance. This may reflect heterotopic bone formation. Ligaments Suboptimally assessed by CT. Muscles and Tendons The visualized musculature is grossly unremarkable in appearance, aside from mild atrophy at the proximal right hamstring. No definite tendon abnormalities are characterized. Soft tissues Scattered vascular calcifications are seen. A small right inguinal hernia is noted, containing only fat. The bladder is moderately distended and grossly unremarkable. Visualized small and large bowel loops are grossly unremarkable. IMPRESSION: 1. Screw fracture through the medial aspect of the right femoral neck screw, with an associated transcervical fracture through the right femoral neck. The medial portion of the screw remains at the right femoral head. Superior displacement of the distal femur. 2. Loosening involving the distal aspect of the right femoral intramedullary nail, measuring up to 5 mm medially. 3. Chronically displaced greater trochanteric fragment noted posterior and medial to the proximal femur. Long osseous fragment inferior to the right femoral neck may reflect heterotopic bone formation. 4. Small right inguinal hernia, containing only fat. 5. Scattered vascular calcifications seen. Electronically Signed   By: Francoise Schaumann.D.  On: 03/29/2016 02:15   Dg Hip Unilat  With Pelvis 2-3 Views Right  Result Date: 03/28/2016 CLINICAL DATA:  Status post fall with right hip pain. EXAM: DG HIP (WITH OR WITHOUT PELVIS) 2-3V RIGHT COMPARISON:  None. FINDINGS: There is a right femoral nail and proximal compression screw. There is a fracture of the compression screw and transcervical fracture of the cervical neck with mild superior displacement of the femoral shaft in coxa vera angulation. Addition, there is a irregularity within the  left superior pubic ramus which may represent a nondisplaced fracture. No other fracture is identified. IMPRESSION: Right proximal femur transcervical neck fracture involving bone and compression screw with mild superior displacement of the femoral shaft and coxa vera angulation. Possible nondisplaced fracture of left superior pubic ramus. Electronically Signed   By: Kristine Garbe M.D.   On: 03/28/2016 22:58        Scheduled Meds: . [START ON 03/30/2016] Influenza vac split quadrivalent PF  0.5 mL Intramuscular Tomorrow-1000  . insulin aspart  0-9 Units Subcutaneous TID WC  . insulin glargine  16 Units Subcutaneous Q2200   Continuous Infusions:    LOS: 0 days    Time spent: 35 minutes,.     Elmarie Shiley, MD [facility] Pager [phone]  If 7PM-7AM, please contact night-coverage [email or web address] Password [email or web address] 03/29/2016, 11:21 AM

## 2016-03-29 NOTE — Progress Notes (Signed)
Initial Nutrition Assessment  DOCUMENTATION CODES:   Obesity unspecified  INTERVENTION:   - Provide chocolate Glucerna oral nutrition supplement TID. Each provides 220 kcal and 10 grams protein. - Encourage PO intake. - Will continue to follow for nutrition-related needs.  NUTRITION DIAGNOSIS:   Increased nutrient needs related to other (see comment) (recent hip fracture) as evidenced by estimated needs.  GOAL:   Patient will meet greater than or equal to 90% of their needs  MONITOR:   PO intake, Supplement acceptance, Labs, Weight trends  REASON FOR ASSESSMENT:   Consult Hip fracture protocol  ASSESSMENT:   60 y.o. female with history of diabetes mellitus and tobacco abuse had a fall last evening at home while walking in the kitchen. Patient states she lost balance and fell. Denies hitting her head or losing consciousness. She did bruise her right shoulder. X-rays in the ER shows right hip fracture and on-call orthopedic surgeon Dr. Ninfa Linden was consulted. Patient is being admitted for further management of right hip fracture.  Spoke with pt at bedside. Pt reports good appetite PTA. Pt states she consumes 3 meals per day. For breakfast, pt consumes cereal. For lunch, pt might consume a sandwich. For dinner, pt consumes whatever is available such as chicken fingers and [place] fries.  Pt states appetite has not been as good as usual since admission; reports feeling "fuller sooner." Pt reports consuming eggs and biscuit for breakfast this AM. Per chart, pt ate 75% of last meal. Pt agreeable to trying Glucerna oral nutrition supplement TID. Pt requests chocolate flavor. Will order.  Pt denies any recent weight loss and is unsure of UBW. Pt states she lost a significant amount of weight when she moved to [place] in 2012. Pt reports she was diagnosed with diabetes shortly thereafter.  Medications reviewed and include sliding scale Novolog, 16 units Lantus daily, 17 gm Miralax  daily, 8.6 mg Senokot daily  Labs reviewed. CBGs: 166, 236 mg/dL  NFPE: Exam completed. No fat depletion, mild muscle depletion, and mild edema noted.  Diet Order:  Diet Carb Modified Fluid consistency: Thin; Room service appropriate? Yes  Skin:  Reviewed, no issues  Last BM:  03/29/16  Height:   Ht Readings from Last 1 Encounters:  03/28/16 5\' 1"  (1.549 m)    Weight:   Wt Readings from Last 1 Encounters:  03/28/16 170 lb (77.1 kg)    Ideal Body Weight:  47.7 kg  BMI:  Body mass index is 32.12 kg/m.  Estimated Nutritional Needs:   Kcal:  1500-1700 (19-22 kcal/kg)  Protein:  72-81 (1.5-1.7 g/kg IBW)  Fluid:  1.5-1.7 L/day  EDUCATION NEEDS:   No education needs identified at this time  Jeb Levering Dietetic Intern Pager Number: [phone]

## 2016-03-30 ENCOUNTER — Inpatient Hospital Stay (HOSPITAL_COMMUNITY): Payer: Medicaid Other

## 2016-03-30 DIAGNOSIS — E1169 Type 2 diabetes mellitus with other specified complication: Secondary | ICD-10-CM

## 2016-03-30 DIAGNOSIS — E669 Obesity, unspecified: Secondary | ICD-10-CM

## 2016-03-30 LAB — GLUCOSE, CAPILLARY
GLUCOSE-CAPILLARY: 237 mg/dL — AB (ref 65–99)
GLUCOSE-CAPILLARY: 260 mg/dL — AB (ref 65–99)
GLUCOSE-CAPILLARY: 208 mg/dL — AB (ref 65–99)
Glucose-Capillary: 148 mg/dL — ABNORMAL HIGH (ref 65–99)

## 2016-03-30 LAB — CBC
HEMATOCRIT: 40.1 % (ref 36.0–46.0)
HEMOGLOBIN: 13.2 g/dL (ref 12.0–15.0)
MCH: 30.3 pg (ref 26.0–34.0)
MCHC: 32.9 g/dL (ref 30.0–36.0)
MCV: 92 fL (ref 78.0–100.0)
Platelets: 308 10*3/uL (ref 150–400)
RBC: 4.36 MIL/uL (ref 3.87–5.11)
RDW: 14 % (ref 11.5–15.5)
WBC: 13.7 10*3/uL — ABNORMAL HIGH (ref 4.0–10.5)

## 2016-03-30 LAB — HEPATIC FUNCTION PANEL
ALBUMIN: 3.4 g/dL — AB (ref 3.5–5.0)
ALT: 16 U/L (ref 14–54)
AST: 21 U/L (ref 15–41)
Alkaline Phosphatase: 56 U/L (ref 38–126)
BILIRUBIN TOTAL: 0.3 mg/dL (ref 0.3–1.2)
Bilirubin, Direct: <0.1 mg/dL — ABNORMAL LOW (ref 0.1–0.5)
TOTAL PROTEIN: 6.5 g/dL (ref 6.5–8.1)

## 2016-03-30 LAB — LIPASE, BLOOD: LIPASE: 20 U/L (ref 11–51)

## 2016-03-30 MED ORDER — SODIUM CHLORIDE 0.9 % IV SOLN
INTRAVENOUS | Status: DC
  Administered 2016-03-30: 10:00:00 via INTRAVENOUS

## 2016-03-30 MED ORDER — BISACODYL 10 MG RE SUPP: 10.0000 mg | Freq: Every day | RECTAL | Status: DC | PRN

## 2016-03-30 NOTE — Plan of Care (Signed)
Problem: Activity: Goal: Ability to ambulate and perform ADLs will improve Outcome: Not Progressing Remains in the bed at this shift  Problem: Education: Goal: Verbalization of understanding the information provided (i.e., activity precautions, restrictions, etc) will improve Outcome: Progressing Patient vrbalized understanding  Problem: Physical Regulation: Goal: Postoperative complications will be avoided or minimized Outcome: Progressing SCDs are on  Problem: Self-Concept: Goal: Verbalizations of decreased anxiety will increase Outcome: Progressing No S/S of anxiety noted  Problem: Pain Management: Goal: Pain level will decrease Outcome: Progressing Medicated once for pain with full relief

## 2016-03-30 NOTE — Progress Notes (Addendum)
Physical Therapy Treatment Patient Details Name: Danielle Sullivan MRN: [id number] DOB: 12/11/1955 Today's Date: 03/30/2016    History of Present Illness Pt is a 60 y/o female admitted following a fall at home, found to have an old R hip fx with failure of hardware. PMH including but not limited to DM and hx of hip fx.    PT Comments    Pt presenting supine in bed with HOB elevated, awake and willing to participate in therapy session. Pt expressed anxiety and fear of falling with mobility. However, she moved very well and required min guard for safety during transfers. PT had planned to perform transfer training with pt's sister today; however, she was unavailable at the time. No caregiver or family present this morning.   According to pt's Case Manager, there is no one available to assist pt upon d/c from hospital. Pt does require 24 hour supervision for safety. PT recommending pt d/c to SNF for further intensive therapy services prior to returning home.  Pt would continue to benefit from skilled physical therapy services at this time while admitted and after d/c to address her limitations in order to improve her overall safety and independence with functional mobility.   Follow Up Recommendations  Supervision/Assistance - 24 hour;SNF     Equipment Recommendations  None recommended by PT;Other (comment) (defer to next venue)    Recommendations for Other Services OT consult     Precautions / Restrictions Precautions Precautions: Fall Restrictions Weight Bearing Restrictions: Yes RLE Weight Bearing: Non weight bearing    Mobility  Bed Mobility Overal bed mobility: Needs Assistance Bed Mobility: Supine to Sit     Supine to sit: HOB elevated;Min assist     General bed mobility comments: pt required increased time and min A with R LE movement to achieve sitting EOB  Transfers Overall transfer level: Needs assistance Equipment used: Rolling walker (2 wheeled) Transfers: Sit  to/from [facility] Sit to Stand: Min guard Stand pivot transfers: Min guard       General transfer comment: pt required increased time, max VC'ing for safety and bilateral hand placement and min guard for safety. pt able to maintain NWB R LE throughout transfers. Pt performed SPT x2 during session.  Ambulation/Gait                 Stairs            Wheelchair Mobility    Modified Rankin (Stroke Patients Only)       Balance Overall balance assessment: Needs assistance Sitting-balance support: Feet supported;Bilateral upper extremity supported Sitting balance-Leahy Scale: Poor     Standing balance support: During functional activity;Bilateral upper extremity supported Standing balance-Leahy Scale: Poor Standing balance comment: pt reliant on bilateral UEs on RW                    Cognition Arousal/Alertness: Awake/alert Behavior During Therapy: [facility] for tasks assessed/performed;Anxious Overall Cognitive Status: No family/caregiver present to determine baseline cognitive functioning Area of Impairment: Memory;Following commands;Safety/judgement;Problem solving     Memory: Decreased short-term memory Following Commands: Follows one step commands consistently;Follows one step commands with increased time;Follows multi-step commands inconsistently Safety/Judgement: Decreased awareness of safety   Problem Solving: Slow processing;Decreased initiation;Difficulty sequencing;Requires verbal cues;Requires tactile cues      Exercises      General Comments        Pertinent Vitals/Pain Pain Assessment: Faces Faces Pain Scale: Hurts a little bit Pain Location: R hip Pain Descriptors / Indicators:  Sore;Guarding Pain Intervention(s): Monitored during session;Repositioned    Home Living                      Prior Function            PT Goals (current goals can now be found in the care plan section) Acute Rehab PT  Goals Patient Stated Goal: decrease pain PT Goal Formulation: With patient Time For Goal Achievement: 04/05/16 Potential to Achieve Goals: Good Progress towards PT goals: Progressing toward goals    Frequency    Min 5X/week      PT Plan Discharge plan needs to be updated    Co-evaluation             End of Session Equipment Utilized During Treatment: Gait belt Activity Tolerance: Patient limited by fatigue;Other (comment) (pt limited secondary to anxiety - mobility, fear of falling) Patient left: in chair;with call bell/phone within reach;with SCD's reapplied;Other (comment) (nurse tech in room)     Time: [phone] PT Time Calculation (min) (ACUTE ONLY): 16 min  Charges:  $Therapeutic Activity: 8-22 mins                    G CodesClearnce Sullivan Danielle Sullivan 03-31-16, 12:04 PM Sherie Don, [place], DPT [phone]

## 2016-03-30 NOTE — Progress Notes (Signed)
Inpatient Diabetes Program Recommendations  AACE/ADA: New Consensus Statement on Inpatient Glycemic Control (2015)  Target Ranges:  Prepandial:   less than 140 mg/dL      Peak postprandial:   less than 180 mg/dL (1-2 hours)      Critically ill patients:  140 - 180 mg/dL   Lab Results  Component Value Date   [id number] (H) 03/30/2016    Review of Glycemic Control  Results for NYLEE, HILLARD (MRN [id number]) as of 03/30/2016 14:42  Ref. Range 03/29/2016 11:37 03/29/2016 16:11 03/29/2016 21:35 03/30/2016 05:30 03/30/2016 11:46  Glucose-Capillary Latest Ref Range: 65 - 99 mg/dL 236 (H) 210 (H) 191 (H) 148 (H) 237 (H)    Diabetes history: Type 2 Outpatient Diabetes medications: none Current orders for Inpatient glycemic control: Lantus 10 units qday, Novolog 0-9 units tid  Inpatient Diabetes Program Recommendations:    Per ADA recommendations "consider performing an A1C on all patients with diabetes or hyperglycemia admitted to the hospital if not performed in the prior 3 months".  Consider increasing the Novolog correction to moderate scale (post prandial blood sugars remain elevated with current scale and patient is eating very little).  Gentry Fitz, RN, BA, MHA, CDE Diabetes Coordinator Inpatient Diabetes Program  [phone] (Team Pager) [phone] ([place]) 03/30/2016 2:46 PM   Gentry Fitz, RN, BA, MHA, CDE Diabetes Coordinator Inpatient Diabetes Program  [phone] (Team Pager) [phone] ([place]) 03/30/2016 2:44 PM

## 2016-03-30 NOTE — Care Management (Signed)
Case manager spoke with patient concerning discharge plan. Patient states she lives with her mother who is currently an patient also. She states her sister is there but not able to assist. PT has recommended SNF for shortterm rehab, CM has notified Education officer, museum. Will continue to monitor.

## 2016-03-30 NOTE — Evaluation (Signed)
Occupational Therapy Evaluation Patient Details Name: Danielle Sullivan MRN: [id number] DOB: Aug 24, 1955 Today's Date: 03/30/2016    History of Present Illness Pt is a 60 y/o female admitted following a fall at home, found to have an old R hip fx with failure of hardware. PMH including but not limited to DM and hx of hip fx.   Clinical Impression   PTA, pt was independent with assistive devices with ADL and was using a cane for functional mobility and RW for long distance community mobility. Pt currently requires min assist for all LB ADL, bed mobility, and functional ambulation at this time. Pt would benefit from further OT services while admitted to improve independence with ADL and ADL transfers. Pt reports that family is unable to provide 24 hour assistance at this time post-acute D/C. Due to lack of assistance at home, pt would be good candidate for SNF placement for short-term rehabilitation to improve independence with ADL and functional mobility. OT will continue to follow while admitted with focus on improved ADL transfers and LB ADL.    Follow Up Recommendations  SNF;Supervision/Assistance - 24 hour    Equipment Recommendations  None recommended by OT       Precautions / Restrictions Precautions Precautions: Fall Restrictions Weight Bearing Restrictions: Yes RLE Weight Bearing: Non weight bearing      Mobility Bed Mobility Overal bed mobility: Needs Assistance Bed Mobility: Supine to Sit;Sit to Supine     Supine to sit: HOB elevated;Min assist Sit to supine: HOB elevated;Min assist   General bed mobility comments: Required increased time, VC's and min assist to move R LE into and out of bed.  Transfers Overall transfer level: Needs assistance Equipment used: Rolling walker (2 wheeled) Transfers: Sit to/from Stand Sit to Stand: Min guard Stand pivot transfers: Min guard            Balance Overall balance assessment: Needs assistance Sitting-balance support: Feet  supported;Single extremity supported Sitting balance-Leahy Scale: Poor     Standing balance support: During functional activity;Bilateral upper extremity supported Standing balance-Leahy Scale: Poor                              ADL Overall ADL's : Needs assistance/impaired Eating/Feeding: Set up;Sitting   Grooming: Set up;Sitting;Brushing hair   Upper Body Bathing: Set up;Sitting   Lower Body Bathing: Minimal assistance   Upper Body Dressing : Set up;Sitting   Lower Body Dressing: Minimal assistance;Sit to/from stand   Toilet Transfer: Minimal assistance;RW;Stand-pivot Toilet Transfer Details (indicate cue type and reason): Simulated in room. Toileting- Water quality scientist and Hygiene: Min guard;Sit to/from stand       Functional mobility during ADLs: Minimal assistance;Rolling walker General ADL Comments: Educated pt on ADL while adhering to NWB status and ice for pain management.               Pertinent Vitals/Pain Pain Assessment: Faces Faces Pain Scale: Hurts little more Pain Location: R hip Pain Descriptors / Indicators: Aching;Sore;Guarding Pain Intervention(s): Limited activity within patient's tolerance;Monitored during session;Repositioned     Hand Dominance Right   Extremity/Trunk Assessment Upper Extremity Assessment Upper Extremity Assessment: Overall WFL for tasks assessed   Lower Extremity Assessment Lower Extremity Assessment: RLE deficits/detail RLE Deficits / Details: Decreased strength and ROM due to pain. Currently R LE NWB status. RLE: Unable to fully assess due to pain       Communication Communication Communication: No difficulties   Cognition Arousal/Alertness: Awake/alert  Behavior During Therapy: [facility] for tasks assessed/performed;Anxious Overall Cognitive Status: No family/caregiver present to determine baseline cognitive functioning Area of Impairment: Memory;Following commands;Safety/judgement;Problem solving      Memory: Decreased short-term memory Following Commands: Follows one step commands consistently;Follows one step commands with increased time;Follows multi-step commands inconsistently Safety/Judgement: Decreased awareness of safety   Problem Solving: Slow processing;Decreased initiation;Difficulty sequencing;Requires verbal cues                Home Living Family/patient expects to be discharged to:: Private residence Living Arrangements: Parent;Other relatives Available Help at Discharge: Family;Available PRN/intermittently Type of Home: House Home Access: Stairs to enter [employer] of Steps: 4 Entrance Stairs-Rails: Right Home Layout: One level     Bathroom Shower/Tub: Tub/shower unit Shower/tub characteristics: Curtain Biochemist, clinical: Standard     Home Equipment: Environmental consultant - 2 wheels;Cane - quad;Shower seat          Prior Functioning/Environment Level of Independence: Independent with assistive device(s)        Comments: Utilizing quad cane to ambulate PTA.        OT Problem List: Decreased strength;Decreased range of motion;Decreased activity tolerance;Impaired balance (sitting and/or standing);Decreased knowledge of use of DME or AE;Pain;Decreased safety awareness;Decreased cognition;Decreased knowledge of precautions   OT Treatment/Interventions: Self-care/ADL training;Therapeutic exercise;Energy conservation;DME and/or AE instruction;Therapeutic activities;Cognitive remediation/compensation;Patient/family education;Balance training    OT Goals(Current goals can be found in the care plan section) Acute Rehab OT Goals Patient Stated Goal: decrease pain OT Goal Formulation: With patient Time For Goal Achievement: 04/13/16 Potential to Achieve Goals: Good ADL Goals Pt Will Perform Lower Body Bathing: with supervision;sit to/from stand Pt Will Perform Lower Body Dressing: with supervision;sit to/from stand Pt Will Transfer to Toilet: with  supervision;bedside commode;ambulating Pt Will Perform Tub/Shower Transfer: with supervision;ambulating;shower seat;rolling walker  OT Frequency: Min 2X/week    End of Session Equipment Utilized During Treatment: Gait belt;Rolling walker  Activity Tolerance: Patient tolerated treatment well Patient left: in bed;with call bell/phone within reach   Time: 1240-1304 OT Time Calculation (min): 24 min Charges:  OT General Charges $OT Visit: 1 Procedure OT Evaluation $OT Eval Moderate Complexity: 1 Procedure OT Treatments $Self Care/Home Management : 8-22 mins  Norman Herrlich, OTR/L [phone] 03/30/2016, 2:43 PM

## 2016-03-30 NOTE — Progress Notes (Signed)
PROGRESS NOTE    Danielle Sullivan  [id number] DOB: 1955-10-17 DOA: 03/28/2016 PCP: Pcp Not In System   Brief Narrative: Danielle Sullivan is a 60 y.o. female with history of diabetes mellitus and tobacco abuse had a fall last evening at home while walking in the kitchen. Patient states she lost balance and fell. Denies hitting her head or losing consciousness. She did bruise her right shoulder. X-rays in the ER shows right hip fracture and on-call orthopedic surgeon Dr. Ninfa Linden was consulted. Patient is being admitted for further management of right hip fracture. Denies any chest pain or shortness of breath.    Assessment & Plan:   Principal Problem:   Hip fracture ([place]) Active Problems:   Diabetes mellitus type 2 in obese [facility])   Closed fracture of right hip ([facility])  Right hip fracture status post mechanical fall;  Dr Ninfa Linden consulted, he think fracture is more chronic, failure of hardware.  Heparin for DVT prophylaxis.  Bowel regimen.  Pain management with fentanyl, Vicodin.  Plan for sx in few weeks.  PT consulted.   Nausea, vomiting; Check KUB, Lipase, LFT.  Zofran PRN>  Diabetes mellitus type 2; Lantus 10 units daily   Leukocytosis - probably reactionary. UA negative. Patient is afebrile. Follow trend.  Chest x ray negative for PNA.  Trending down.     Tobacco abuse - tobacco cessation counseling requested.   DVT prophylaxis: heparin  Code Status: full code.  Family Communication: discussed care with patient.  Disposition Plan: remain inpatient for sx.    Consultants:      Procedures: none   Antimicrobials: none   Subjective: Having hip pain. Also started to have nausea, vomit.  No BM   Objective: Vitals:   03/29/16 0400 03/29/16 1301 03/29/16 2011 03/30/16 0500  BP: 124/61 (!) 141/63 (!) 149/65 130/87  Pulse: (!) 102 90 92 78  Resp: 18 17 17 17   Temp: 97.9 F (36.6 C) 97.9 F (36.6 C) 98.6 F (37 C) 97.9 F (36.6 C)  TempSrc: Oral Oral Oral  Oral  SpO2: 95% 93% 94% 92%  Weight:      Height:        Intake/Output Summary (Last 24 hours) at 03/30/16 0947 Last data filed at 03/29/16 1301  Gross per 24 hour  Intake              240 ml  Output                0 ml  Net              240 ml   Filed Weights   03/28/16 2133  Weight: 77.1 kg (170 lb)    Examination:  General exam: Appears calm and comfortable  Respiratory system: Clear to auscultation. Respiratory effort normal. Cardiovascular system: S1 & S2 heard, RRR. No JVD, murmurs, rubs, gallops or clicks. No pedal edema. Gastrointestinal system: Abdomen is nondistended, soft and nontender. No organomegaly or masses felt. Normal bowel sounds heard. Central nervous system: Alert and oriented. No focal neurological deficits. Extremities:  Moves all extremities.  Skin: No rashes, lesions or ulcers Psychiatry: Judgement and insight appear normal. Mood & affect appropriate.     Data Reviewed: I have personally reviewed following labs and imaging studies  CBC:  Recent Labs Lab 03/28/16 2322 03/29/16 0436 03/30/16 0654  WBC 18.0* 16.2* 13.7*  NEUTROABS 15.4*  --   --   HGB 14.3 13.4 13.2  HCT 43.2 41.4 40.1  MCV 91.3 91.2 92.0  PLT 324 338 [id number]   Basic Metabolic Panel:  Recent Labs Lab 03/28/16 2322 03/29/16 0436  NA 136 136  K 5.0 4.0  CL 100* 101  CO2 27 26  GLUCOSE 194* 155*  BUN 19 16  CREATININE 0.67 0.57  CALCIUM 9.4 9.0   GFR: Estimated Creatinine Clearance: 68.5 mL/min (by C-G formula based on SCr of 0.57 mg/dL). Liver Function Tests: No results for input(s): AST, ALT, ALKPHOS, BILITOT, PROT, ALBUMIN in the last 168 hours. No results for input(s): LIPASE, AMYLASE in the last 168 hours. No results for input(s): AMMONIA in the last 168 hours. Coagulation Profile: No results for input(s): INR, PROTIME in the last 168 hours. Cardiac Enzymes: No results for input(s): CKTOTAL, CKMB, CKMBINDEX, TROPONINI in the last 168 hours. BNP (last 3  results) No results for input(s): PROBNP in the last 8760 hours. HbA1C: No results for input(s): HGBA1C in the last 72 hours. CBG:  Recent Labs Lab 03/29/16 0502 03/29/16 1137 03/29/16 1611 03/29/16 2135 03/30/16 0530  GLUCAP 166* 236* 210* 191* 148*   Lipid Profile: No results for input(s): CHOL, HDL, LDLCALC, TRIG, CHOLHDL, LDLDIRECT in the last 72 hours. Thyroid Function Tests: No results for input(s): TSH, T4TOTAL, FREET4, T3FREE, THYROIDAB in the last 72 hours. Anemia Panel: No results for input(s): VITAMINB12, FOLATE, FERRITIN, TIBC, IRON, RETICCTPCT in the last 72 hours. Sepsis Labs: No results for input(s): PROCALCITON, LATICACIDVEN in the last 168 hours.  No results found for this or any previous visit (from the past 240 hour(s)).       Radiology Studies: Dg Chest 1 View  Result Date: 03/28/2016 CLINICAL DATA:  Acute onset of shortness of breath. Preoperative radiograph at the right hip. Initial encounter. EXAM: CHEST 1 VIEW COMPARISON:  None. FINDINGS: The lungs are well-aerated. Mild vascular congestion is noted. Mildly increased interstitial markings may reflect mild interstitial edema. There is no evidence of pleural effusion or pneumothorax. The cardiomediastinal silhouette is borderline normal in size. No acute osseous abnormalities are seen. IMPRESSION: Mild vascular congestion noted. Mildly increased interstitial markings may reflect mild interstitial edema. Electronically Signed   By: Garald Balding M.D.   On: 03/28/2016 23:17   Ct Hip Right Wo Contrast  Result Date: 03/29/2016 CLINICAL DATA:  Status post fall in kitchen, with right hip pain. Initial encounter. EXAM: CT OF THE RIGHT HIP WITHOUT CONTRAST TECHNIQUE: Multidetector CT imaging of the right hip was performed according to the standard protocol. Multiplanar CT image reconstructions were also generated. COMPARISON:  None. FINDINGS: Bones/Joint/Cartilage There is a screw fracture through the medial  aspect of the patient's right femoral neck screw, with an associated transcervical fracture through the right femoral neck. The medial portion of the screw remains at the right femoral head. There is superior displacement of the distal femur. There is also loosening involving the distal aspect of the patient's intramedullary nail, measuring up to 5 mm medially. There appears to be a chronically displaced greater trochanteric fragment posterior and medial to the proximal femur. The right femoral head remains seated at the acetabulum. A long osseous fragment is noted inferior to the right femoral neck, of uncertain significance. This may reflect heterotopic bone formation. Ligaments Suboptimally assessed by CT. Muscles and Tendons The visualized musculature is grossly unremarkable in appearance, aside from mild atrophy at the proximal right hamstring. No definite tendon abnormalities are characterized. Soft tissues Scattered vascular calcifications are seen. A small right inguinal hernia is noted, containing only fat. The bladder is moderately distended and grossly  unremarkable. Visualized small and large bowel loops are grossly unremarkable. IMPRESSION: 1. Screw fracture through the medial aspect of the right femoral neck screw, with an associated transcervical fracture through the right femoral neck. The medial portion of the screw remains at the right femoral head. Superior displacement of the distal femur. 2. Loosening involving the distal aspect of the right femoral intramedullary nail, measuring up to 5 mm medially. 3. Chronically displaced greater trochanteric fragment noted posterior and medial to the proximal femur. Long osseous fragment inferior to the right femoral neck may reflect heterotopic bone formation. 4. Small right inguinal hernia, containing only fat. 5. Scattered vascular calcifications seen. Electronically Signed   By: Garald Balding M.D.   On: 03/29/2016 02:15   Dg Hip Unilat  With Pelvis 2-3  Views Right  Result Date: 03/28/2016 CLINICAL DATA:  Status post fall with right hip pain. EXAM: DG HIP (WITH OR WITHOUT PELVIS) 2-3V RIGHT COMPARISON:  None. FINDINGS: There is a right femoral nail and proximal compression screw. There is a fracture of the compression screw and transcervical fracture of the cervical neck with mild superior displacement of the femoral shaft in coxa vera angulation. Addition, there is a irregularity within the left superior pubic ramus which may represent a nondisplaced fracture. No other fracture is identified. IMPRESSION: Right proximal femur transcervical neck fracture involving bone and compression screw with mild superior displacement of the femoral shaft and coxa vera angulation. Possible nondisplaced fracture of left superior pubic ramus. Electronically Signed   By: Kristine Garbe M.D.   On: 03/28/2016 22:58        Scheduled Meds: . feeding supplement (GLUCERNA SHAKE)  237 mL Oral TID BM  . heparin subcutaneous  5,000 Units Subcutaneous Q8H  . Influenza vac split quadrivalent PF  0.5 mL Intramuscular Tomorrow-1000  . insulin aspart  0-9 Units Subcutaneous TID WC  . insulin glargine  10 Units Subcutaneous QHS  . polyethylene glycol  17 g Oral Daily  . senna  1 tablet Oral Daily   Continuous Infusions: . sodium chloride       LOS: 1 day    Time spent: 35 minutes,.     Elmarie Shiley, MD [facility] Pager [phone]  If 7PM-7AM, please contact night-coverage [email or web address] Password [email or web address] 03/30/2016, 9:47 AM

## 2016-03-31 LAB — GLUCOSE, CAPILLARY
GLUCOSE-CAPILLARY: 142 mg/dL — AB (ref 65–99)
GLUCOSE-CAPILLARY: 206 mg/dL — AB (ref 65–99)
GLUCOSE-CAPILLARY: 125 mg/dL — AB (ref 65–99)
Glucose-Capillary: 253 mg/dL — ABNORMAL HIGH (ref 65–99)

## 2016-03-31 NOTE — Progress Notes (Signed)
Physical Therapy Treatment Patient Details Name: Danielle Sullivan MRN: [id number] DOB: [birth year] Today's Date: 03/31/2016    History of Present Illness Pt is a 60 y/o female admitted following a fall at home, found to have an old R hip fx with failure of hardware. PMH including but not limited to DM and hx of hip fx.    PT Comments    Patient tolerated treatment well. Her mobility is still limited. She was able to get to the chair while maintaining NWB status. Follow up recommendations remain the same.   Follow Up Recommendations  Supervision/Assistance - 24 hour;SNF     Equipment Recommendations  None recommended by PT;Other (comment) (defer to next venue)    Recommendations for Other Services       Precautions / Restrictions Precautions Precautions: Fall Restrictions Weight Bearing Restrictions: Yes RLE Weight Bearing: Non weight bearing    Mobility  Bed Mobility Overal bed mobility: Needs Assistance Bed Mobility: Supine to Sit;Sit to Supine     Supine to sit: HOB elevated;Min assist Sit to supine: HOB elevated;Min assist   General bed mobility comments: Required min a to scoot to the edge of the bed.   Transfers Overall transfer level: Needs assistance Equipment used: Rolling walker (2 wheeled)   Sit to Stand: Min guard Stand pivot transfers: Min guard       General transfer comment: Able to scoot hop to the chair.   Ambulation/Gait Ambulation/Gait assistance: Min assist Ambulation Distance (Feet): 2 Feet         General Gait Details: Able to hop 2 steps to the chair with min A for stability. Patient fatigued after transfer.    Stairs            Wheelchair Mobility    Modified Rankin (Stroke Patients Only)       Balance Overall balance assessment: Needs assistance Sitting-balance support: Feet supported Sitting balance-Leahy Scale: Poor     Standing balance support: Bilateral upper extremity supported Standing balance-Leahy Scale:  Poor Standing balance comment: needs RW                     Cognition Arousal/Alertness: Awake/alert Behavior During Therapy: WFL for tasks assessed/performed;Anxious Overall Cognitive Status: No family/caregiver present to determine baseline cognitive functioning Area of Impairment: Memory;Following commands;Safety/judgement;Problem solving     Memory: Decreased short-term memory Following Commands: Follows one step commands consistently;Follows one step commands with increased time;Follows multi-step commands inconsistently Safety/Judgement: Decreased awareness of safety   Problem Solving: Slow processing;Decreased initiation;Difficulty sequencing;Requires verbal cues      Exercises      General Comments        Pertinent Vitals/Pain Pain Assessment: Faces Faces Pain Scale: Hurts a little bit Pain Location: R hip  Pain Descriptors / Indicators: Aching;Sore Pain Intervention(s): Limited activity within patient's tolerance    Home Living                      Prior Function            PT Goals (current goals can now be found in the care plan section) Acute Rehab PT Goals Patient Stated Goal: Patient reported nausea yesterday but she is willing to get out of bed with therapy.  PT Goal Formulation: With patient Time For Goal Achievement: 04/05/16 Potential to Achieve Goals: Good Progress towards PT goals: Progressing toward goals    Frequency    Min 5X/week      PT Plan Discharge plan needs  to be updated    Co-evaluation             End of Session Equipment Utilized During Treatment: Gait belt Activity Tolerance: Patient limited by fatigue;Other (comment) (pt limited secondary to anxiety - mobility, fear of falling) Patient left: in chair;with call bell/phone within reach;with SCD's reapplied;Other (comment) (nurse tech in room)     Time: [phone] PT Time Calculation (min) (ACUTE ONLY): 18 min  Charges:  $Therapeutic Activity: 8-22  mins                    G Codes:      Carney Living PT DPT  03/31/2016, 3:13 PM

## 2016-03-31 NOTE — Progress Notes (Signed)
PROGRESS NOTE    Danielle Sullivan  [id number] DOB: Aug 28, 1955 DOA: 03/28/2016 PCP: Pcp Not In System   Brief Narrative: Danielle Sullivan is a 60 y.o. female with history of diabetes mellitus and tobacco abuse had a fall last evening at home while walking in the kitchen. Patient states she lost balance and fell. Denies hitting her head or losing consciousness. She did bruise her right shoulder. X-rays in the ER shows right hip fracture and on-call orthopedic surgeon Dr. Ninfa Linden was consulted. Patient is being admitted for further management of right hip fracture. Denies any chest pain or shortness of breath.    Assessment & Plan:   Principal Problem:   Hip fracture ([place]) Active Problems:   Diabetes mellitus type 2 in obese [facility])   Closed fracture of right hip (HCC)  Right hip fracture status post mechanical fall;  Dr Ninfa Linden consulted, he think fracture is more chronic, failure of hardware.  Heparin for DVT prophylaxis.  Bowel regimen.  Pain management with fentanyl, Vicodin.  Plan for sx in few weeks.  Need, SNF.    Nausea, vomiting; KUB negative, Lipase, LFT negative Zofran PRN> Resolved.   Diabetes mellitus type 2; Lantus 10 units daily   Leukocytosis - probably reactionary. UA negative. Patient is afebrile. Follow trend.  Chest x ray negative for PNA.  Trending down.     Tobacco abuse - tobacco cessation counseling requested.   DVT prophylaxis: heparin  Code Status: full code.  Family Communication: discussed care with patient.  Disposition Plan:  Awaiting SNF   Consultants:      Procedures: none   Antimicrobials: none   Subjective: Feeling ok, no more nausea.   Objective: Vitals:   03/30/16 1500 03/30/16 1942 03/31/16 0425 03/31/16 1324  BP: 132/68 138/65 (!) 144/62 (!) 157/71  Pulse: 88 96 85 89  Resp: 17 17 17 19   Temp: 98.9 F (37.2 C) 99.4 F (37.4 C) 98.9 F (37.2 C) 98.5 F (36.9 C)  TempSrc: Oral Oral Oral Oral  SpO2: 93% 94% 96%  95%  Weight:      Height:        Intake/Output Summary (Last 24 hours) at 03/31/16 1540 Last data filed at 03/30/16 1900  Gross per 24 hour  Intake              240 ml  Output                0 ml  Net              240 ml   Filed Weights   03/28/16 2133  Weight: 77.1 kg (170 lb)    Examination:  General exam: Appears calm and comfortable  Respiratory system: Clear to auscultation. Respiratory effort normal. Cardiovascular system: S1 & S2 heard, RRR. No JVD, murmurs, rubs, gallops or clicks. No pedal edema. Gastrointestinal system: Abdomen is nondistended, soft and nontender. No organomegaly or masses felt. Normal bowel sounds heard. Central nervous system: Alert and oriented. No focal neurological deficits. Extremities:  Moves all extremities.  Skin: No rashes, lesions or ulcers Psychiatry: Judgement and insight appear normal. Mood & affect appropriate.     Data Reviewed: I have personally reviewed following labs and imaging studies  CBC:  Recent Labs Lab 03/28/16 2322 03/29/16 0436 03/30/16 0654  WBC 18.0* 16.2* 13.7*  NEUTROABS 15.4*  --   --   HGB 14.3 13.4 13.2  HCT 43.2 41.4 40.1  MCV 91.3 91.2 92.0  PLT 324 338 308  Basic Metabolic Panel:  Recent Labs Lab 03/28/16 2322 03/29/16 0436  NA 136 136  K 5.0 4.0  CL 100* 101  CO2 27 26  GLUCOSE 194* 155*  BUN 19 16  CREATININE 0.67 0.57  CALCIUM 9.4 9.0   GFR: Estimated Creatinine Clearance: 68.5 mL/min (by C-G formula based on SCr of 0.57 mg/dL). Liver Function Tests:  Recent Labs Lab 03/30/16 0654  AST 21  ALT 16  ALKPHOS 56  BILITOT 0.3  PROT 6.5  ALBUMIN 3.4*    Recent Labs Lab 03/30/16 0654  LIPASE 20   No results for input(s): AMMONIA in the last 168 hours. Coagulation Profile: No results for input(s): INR, PROTIME in the last 168 hours. Cardiac Enzymes: No results for input(s): CKTOTAL, CKMB, CKMBINDEX, TROPONINI in the last 168 hours. BNP (last 3 results) No results for  input(s): PROBNP in the last 8760 hours. HbA1C: No results for input(s): HGBA1C in the last 72 hours. CBG:  Recent Labs Lab 03/30/16 1146 03/30/16 1629 03/30/16 2004 03/31/16 0622 03/31/16 1134  GLUCAP 237* 260* 208* 142* 206*   Lipid Profile: No results for input(s): CHOL, HDL, LDLCALC, TRIG, CHOLHDL, LDLDIRECT in the last 72 hours. Thyroid Function Tests: No results for input(s): TSH, T4TOTAL, FREET4, T3FREE, THYROIDAB in the last 72 hours. Anemia Panel: No results for input(s): VITAMINB12, FOLATE, FERRITIN, TIBC, IRON, RETICCTPCT in the last 72 hours. Sepsis Labs: No results for input(s): PROCALCITON, LATICACIDVEN in the last 168 hours.  No results found for this or any previous visit (from the past 240 hour(s)).       Radiology Studies: Dg Abd 1 View  Result Date: 03/30/2016 CLINICAL DATA:  General abdominal pain with nausea and vomiting for a few days. EXAM: ABDOMEN - 1 VIEW COMPARISON:  CT of the right hip 03/29/2016. FINDINGS: There is a non obstructive bowel gas pattern. No supine evidence of free air. No organomegaly or suspicious calcification.No acute bony abnormality. Hardware noted in the proximal right femur with fractured femoral neck screw, stable since prior CT with femoral neck fracture again noted, stable. This may represent nonunion. IMPRESSION: No acute findings.  No evidence of bowel obstruction or free air. Electronically Signed   By: Rolm Baptise M.D.   On: 03/30/2016 11:36        Scheduled Meds: . feeding supplement (GLUCERNA SHAKE)  237 mL Oral TID BM  . heparin subcutaneous  5,000 Units Subcutaneous Q8H  . insulin aspart  0-9 Units Subcutaneous TID WC  . insulin glargine  10 Units Subcutaneous QHS  . polyethylene glycol  17 g Oral Daily  . senna  1 tablet Oral Daily   Continuous Infusions: . sodium chloride 50 mL/hr at 03/30/16 0951     LOS: 2 days    Time spent: 35 minutes,.     Elmarie Shiley, MD [facility] Pager  [phone]  If 7PM-7AM, please contact night-coverage [email or web address] Password [email or web address] 03/31/2016, 3:40 PM

## 2016-03-31 NOTE — Clinical Social Work Note (Signed)
Clinical Social Work Assessment  Patient Details  Name: Danielle Sullivan MRN: [id number] Date of Birth: 01/26/1956  Date of referral:  03/31/16               Reason for consult:  Discharge Planning                Permission sought to share information with:  Case Manager, Facility Sport and exercise psychologist, Family Supports Permission granted to share information::  Yes, Verbal Permission Granted  Name::        Agency::   (SNF)  Relationship::     Contact Information:     Housing/Transportation Living arrangements for the past 2 months:  Single Family Home Source of Information:  Patient, Medical Team, Other (Comment Required) Patient Interpreter Needed:  None Criminal Activity/Legal Involvement Pertinent to Current Situation/Hospitalization:  No - Comment as needed Significant Relationships:  Other Family Members Lives with:  Self Do you feel safe going back to the place where you live?  No Need for family participation in patient care:  Yes (Comment)  Care giving concerns: Pt and niece expressed no care giving concerns at this time. Pt agreeable to SNF for higher level of care.    Social Worker assessment / plan: Holiday representative met with pt and niece and discussed CSW role with discharge planning. CSW also explained PT recommendation for SNF. Pt agreeable to SNF and stated [place]. Pt also informed CSW she is having hip surgery soon and may return back to the hospital from SNF.   CSW will follow up with bed offers once available.   Employment status:  Retired Forensic scientist:    PT Recommendations:  [place] / Referral to community resources:  [place]  Patient/Family's Response to care: Pt pleasant and lying in bed. Pt appears to be happy with care she is receiving at [facility].   Patient/Family's Understanding of and Emotional Response to Diagnosis, Current Treatment, and Prognosis: Pt  and family appear to have a good understanding of reason for admission into the hospital and with pt care plan.   Emotional Assessment Appearance:  Appears stated age Attitude/Demeanor/Rapport:   (Pleasant) Affect (typically observed):  Accepting, Appropriate, Pleasant Orientation:  Oriented to Self, Oriented to Place, Oriented to  Time, Oriented to Situation Alcohol / Substance use:  Not Applicable Psych involvement (Current and /or in the community):  No (Comment)  Discharge Needs  Concerns to be addressed:  Discharge Planning Concerns Readmission within the last 30 days:  No Current discharge risk:  None Barriers to Discharge:  Continued Medical Work up   Junie Spencer, LCSW 03/31/2016, 3:33 PM

## 2016-03-31 NOTE — Plan of Care (Signed)
Problem: Physical Regulation: Goal: Will remain free from infection Outcome: Progressing No S/S of infection noted Goal: Postoperative complications will be avoided or minimized Outcome: Progressing No post op complications noted  Problem: Pain Management: Goal: Pain level will decrease Outcome: Progressing Medicated twice for pain with full relief

## 2016-03-31 NOTE — Progress Notes (Signed)
Patient ID: Danielle Sullivan, female   DOB: 1955/07/08, 60 y.o.   MRN: [id number] No acute changes.  The family was able to get me the name of the Ortho MD who did her original surgery in [place] and the hospital as well as the op note.  How ever, the op note did not state the actual company that was used for the hardware.  I will call that MD and/or hospital early next week to get more specific information.  My plan is still to consider a complex operation on her right hip at a later date.  Still will need SNF placement in the interim.

## 2016-03-31 NOTE — NC FL2 (Signed)
[place] LEVEL OF CARE SCREENING TOOL     IDENTIFICATION  Patient Name: Danielle Sullivan Birthdate: 01-10-1956 Sex: female Admission Date (Current Location): 03/28/2016  [facility] and [place] Number:  Herbalist and Address:  [facility]. [facility], [place] [address], [place], [place] [address]      Provider Number: [id number]  Attending Physician Name and Address:  Elmarie Shiley, MD  Relative Name and Phone Number:       Current Level of Care: Hospital Recommended Level of Care: [place] Prior Approval Number:    Date Approved/Denied:   PASRR Number:    Discharge Plan: SNF    Current Diagnoses: Patient Active Problem List   Diagnosis Date Noted  . Hip fracture ([place]) 03/29/2016  . Diabetes mellitus type 2 in obese ([place]) 03/29/2016  . Closed fracture of right hip ([place])     Orientation RESPIRATION BLADDER Height & Weight     Self, Time, Situation, Place  Normal Continent Weight: 170 lb (77.1 kg) Height:  5\' 1"  (154.9 cm)  BEHAVIORAL SYMPTOMS/MOOD NEUROLOGICAL BOWEL NUTRITION STATUS   (None)  (none) Continent  (carb modified)  AMBULATORY STATUS COMMUNICATION OF NEEDS Skin   Extensive Assist Verbally Normal                       Personal Care Assistance Level of Assistance  Bathing, Feeding, Dressing Bathing Assistance: Limited assistance Feeding assistance: Independent Dressing Assistance: Limited assistance     Functional Limitations Info  Sight, Hearing, Speech Sight Info: Adequate Hearing Info: Adequate Speech Info: Adequate    SPECIAL CARE FACTORS FREQUENCY  PT (By licensed PT), OT (By licensed OT)     PT Frequency:  (5x/week) OT Frequency:  (5x/week)            Contractures Contractures Info: Not present    Additional Factors Info  Code Status, Allergies Code Status Info:  (Full) Allergies Info:  [place])           Current Medications (03/31/2016):  This is the current hospital  active medication list Current Facility-Administered Medications  Medication Dose Route Frequency Provider Last Rate Last Dose  . 0.9 %  sodium chloride infusion   Intravenous Continuous Belkys A Regalado, MD 50 mL/hr at 03/30/16 0951    . bisacodyl (DULCOLAX) suppository 10 mg  10 mg Rectal Daily PRN Belkys A Regalado, MD      . feeding supplement (GLUCERNA SHAKE) (GLUCERNA SHAKE) liquid 237 mL  237 mL Oral TID BM Dale Tracyton, RD   237 mL at 03/31/16 1027  . heparin injection 5,000 Units  5,000 Units Subcutaneous Q8H Belkys A Regalado, MD   5,000 Units at 03/31/16 1227  . HYDROcodone-acetaminophen (NORCO/VICODIN) 5-325 MG per tablet 1-2 tablet  1-2 tablet Oral Q6H PRN Rise Patience, MD   1 tablet at 03/30/16 2132  . insulin aspart (novoLOG) injection 0-9 Units  0-9 Units Subcutaneous TID WC Rise Patience, MD   2 Units at 03/31/16 1228  . insulin glargine (LANTUS) injection 10 Units  10 Units Subcutaneous QHS Belkys A Regalado, MD   10 Units at 03/30/16 2132  . morphine 2 MG/ML injection 0.5 mg  0.5 mg Intravenous Q2H PRN Rise Patience, MD   0.5 mg at 03/29/16 1607  . ondansetron (ZOFRAN) injection 4 mg  4 mg Intravenous Q6H PRN Mcarthur Rossetti, MD      . polyethylene glycol [facility] / GLYCOLAX) packet  17 g  17 g Oral Daily Belkys A Regalado, MD   17 g at 03/31/16 1026  . senna (SENOKOT) tablet 8.6 mg  1 tablet Oral Daily Belkys A Regalado, MD   8.6 mg at 03/31/16 1027     Discharge Medications: Please see discharge summary for a list of discharge medications.  Relevant Imaging Results:  Relevant Lab Results:   Additional Information    Junie Spencer, LCSW

## 2016-04-01 LAB — GLUCOSE, CAPILLARY
GLUCOSE-CAPILLARY: 173 mg/dL — AB (ref 65–99)
GLUCOSE-CAPILLARY: 221 mg/dL — AB (ref 65–99)
Glucose-Capillary: 190 mg/dL — ABNORMAL HIGH (ref 65–99)
Glucose-Capillary: 177 mg/dL — ABNORMAL HIGH (ref 65–99)

## 2016-04-01 NOTE — Progress Notes (Signed)
PROGRESS NOTE    Danielle Sullivan  [id number] DOB: [birth year] DOA: 03/28/2016 PCP: Pcp Not In System   Brief Narrative: Danielle Sullivan is a 60 y.o. female with history of diabetes mellitus and tobacco abuse had a fall last evening at home while walking in the kitchen. Patient states she lost balance and fell. Denies hitting her head or losing consciousness. She did bruise her right shoulder. X-rays in the ER shows right hip fracture and on-call orthopedic surgeon Dr. Ninfa Linden was consulted. Patient is being admitted for further management of right hip fracture. Denies any chest pain or shortness of breath.    Assessment & Plan:   Principal Problem:   Hip fracture ([place]) Active Problems:   Diabetes mellitus type 2 in obese [facility])   Closed fracture of right hip (HCC)  Right hip fracture status post mechanical fall;  Dr Ninfa Linden consulted, he think fracture is more chronic, failure of hardware.  Heparin for DVT prophylaxis.  Bowel regimen.  Pain management with fentanyl, Vicodin.  Plan for sx in few weeks.  Need, SNF.  Awaiting SNF  Nausea, vomiting; KUB negative, Lipase, LFT negative Zofran PRN> Resolved.   Diabetes mellitus type 2; Lantus 10 units daily   Leukocytosis - probably reactionary. UA negative. Patient is afebrile. Follow trend.  Chest x ray negative for PNA.  Trending down.     Tobacco abuse - tobacco cessation counseling requested.   DVT prophylaxis: heparin  Code Status: full code.  Family Communication: discussed care with patient. And sister.  Disposition Plan:  Awaiting SNF   Consultants:      Procedures: none   Antimicrobials: none   Subjective: No new complaints.  The room is hot,..   Objective: Vitals:   03/31/16 0425 03/31/16 1324 03/31/16 2106 04/01/16 0419  BP: (!) 144/62 (!) 157/71 (!) 160/62 (!) 152/66  Pulse: 85 89 83 75  Resp: 17 19 17    Temp: 98.9 F (37.2 C) 98.5 F (36.9 C) 98.7 F (37.1 C) 98.3 F (36.8 C)  TempSrc:  Oral Oral Oral Oral  SpO2: 96% 95% 95% 98%  Weight:      Height:        Intake/Output Summary (Last 24 hours) at 04/01/16 1127 Last data filed at 04/01/16 0622  Gross per 24 hour  Intake          2465.83 ml  Output                0 ml  Net          2465.83 ml   Filed Weights   03/28/16 2133  Weight: 77.1 kg (170 lb)    Examination:  General exam: Appears calm and comfortable  Respiratory system: Clear to auscultation. Respiratory effort normal. Cardiovascular system: S1 & S2 heard, RRR. No JVD, murmurs, rubs, gallops or clicks. No pedal edema. Gastrointestinal system: Abdomen is nondistended, soft and nontender. No organomegaly or masses felt. Normal bowel sounds heard. Central nervous system: Alert and oriented. No focal neurological deficits. Extremities:  Moves all extremities.  Skin: No rashes, lesions or ulcers Psychiatry: Judgement and insight appear normal. Mood & affect appropriate.     Data Reviewed: I have personally reviewed following labs and imaging studies  CBC:  Recent Labs Lab 03/28/16 2322 03/29/16 0436 03/30/16 0654  WBC 18.0* 16.2* 13.7*  NEUTROABS 15.4*  --   --   HGB 14.3 13.4 13.2  HCT 43.2 41.4 40.1  MCV 91.3 91.2 92.0  PLT 324 338 308  Basic Metabolic Panel:  Recent Labs Lab 03/28/16 2322 03/29/16 0436  NA 136 136  K 5.0 4.0  CL 100* 101  CO2 27 26  GLUCOSE 194* 155*  BUN 19 16  CREATININE 0.67 0.57  CALCIUM 9.4 9.0   GFR: Estimated Creatinine Clearance: 68.5 mL/min (by C-G formula based on SCr of 0.57 mg/dL). Liver Function Tests:  Recent Labs Lab 03/30/16 0654  AST 21  ALT 16  ALKPHOS 56  BILITOT 0.3  PROT 6.5  ALBUMIN 3.4*    Recent Labs Lab 03/30/16 0654  LIPASE 20   No results for input(s): AMMONIA in the last 168 hours. Coagulation Profile: No results for input(s): INR, PROTIME in the last 168 hours. Cardiac Enzymes: No results for input(s): CKTOTAL, CKMB, CKMBINDEX, TROPONINI in the last 168  hours. BNP (last 3 results) No results for input(s): PROBNP in the last 8760 hours. HbA1C: No results for input(s): HGBA1C in the last 72 hours. CBG:  Recent Labs Lab 03/31/16 0622 03/31/16 1134 03/31/16 1620 03/31/16 2105 04/01/16 0644  GLUCAP 142* 206* 253* 125* 173*   Lipid Profile: No results for input(s): CHOL, HDL, LDLCALC, TRIG, CHOLHDL, LDLDIRECT in the last 72 hours. Thyroid Function Tests: No results for input(s): TSH, T4TOTAL, FREET4, T3FREE, THYROIDAB in the last 72 hours. Anemia Panel: No results for input(s): VITAMINB12, FOLATE, FERRITIN, TIBC, IRON, RETICCTPCT in the last 72 hours. Sepsis Labs: No results for input(s): PROCALCITON, LATICACIDVEN in the last 168 hours.  No results found for this or any previous visit (from the past 240 hour(s)).       Radiology Studies: Dg Abd 1 View  Result Date: 03/30/2016 CLINICAL DATA:  General abdominal pain with nausea and vomiting for a few days. EXAM: ABDOMEN - 1 VIEW COMPARISON:  CT of the right hip 03/29/2016. FINDINGS: There is a non obstructive bowel gas pattern. No supine evidence of free air. No organomegaly or suspicious calcification.No acute bony abnormality. Hardware noted in the proximal right femur with fractured femoral neck screw, stable since prior CT with femoral neck fracture again noted, stable. This may represent nonunion. IMPRESSION: No acute findings.  No evidence of bowel obstruction or free air. Electronically Signed   By: Rolm Baptise M.D.   On: 03/30/2016 11:36        Scheduled Meds: . feeding supplement (GLUCERNA SHAKE)  237 mL Oral TID BM  . heparin subcutaneous  5,000 Units Subcutaneous Q8H  . insulin aspart  0-9 Units Subcutaneous TID WC  . insulin glargine  10 Units Subcutaneous QHS  . polyethylene glycol  17 g Oral Daily  . senna  1 tablet Oral Daily   Continuous Infusions: . sodium chloride 50 mL/hr at 03/30/16 0951     LOS: 3 days    Time spent: 25 minutes,.      Elmarie Shiley, MD [facility] Pager [phone]  If 7PM-7AM, please contact night-coverage [email or web address] Password [email or web address] 04/01/2016, 11:27 AM

## 2016-04-02 LAB — GLUCOSE, CAPILLARY
Glucose-Capillary: 165 mg/dL — ABNORMAL HIGH (ref 65–99)
Glucose-Capillary: 257 mg/dL — ABNORMAL HIGH (ref 65–99)
Glucose-Capillary: 282 mg/dL — ABNORMAL HIGH (ref 65–99)

## 2016-04-02 MED ORDER — GLUCERNA SHAKE PO LIQD: 237.0000 mL | Freq: Three times a day (TID) | ORAL | 0 refills | Status: DC

## 2016-04-02 MED ORDER — BISACODYL 10 MG RE SUPP: 10.0000 mg | Freq: Every day | RECTAL | 0 refills | Status: DC | PRN

## 2016-04-02 MED ORDER — POLYETHYLENE GLYCOL 3350 17 G PO PACK: 17.0000 g | PACK | Freq: Every day | ORAL | 0 refills | Status: AC

## 2016-04-02 MED ORDER — INSULIN GLARGINE 100 UNIT/ML ~~LOC~~ SOLN: 10.0000 [IU] | Freq: Every day | SUBCUTANEOUS | 11 refills | Status: AC

## 2016-04-02 MED ORDER — HYDROCODONE-ACETAMINOPHEN 5-325 MG PO TABS: 1.0000 | ORAL_TABLET | Freq: Four times a day (QID) | ORAL | 0 refills | Status: DC | PRN

## 2016-04-02 MED ORDER — ENOXAPARIN SODIUM 40 MG/0.4ML ~~LOC~~ SOLN
40.0000 mg | SUBCUTANEOUS | 0 refills | Status: DC
Start: 2016-04-02 — End: 2016-04-23

## 2016-04-02 NOTE — Progress Notes (Signed)
Called report to SNF in [place].  Reviewed discharge instructions/medications with patient.   Answered all of her questions.

## 2016-04-02 NOTE — Clinical Social Work Placement (Signed)
   CLINICAL SOCIAL WORK PLACEMENT  NOTE  Date:  04/02/2016  Patient Details  Name: Shainah Talwar MRN: [id number] Date of Birth: 20-Jan-1956  Clinical Social Work is seeking post-discharge placement for this patient at the [place] level of care (*CSW will initial, date and re-position this form in  chart as items are completed):  Yes   Patient/family provided with [place] Work Department's list of facilities offering this level of care within the geographic area requested by the patient (or if unable, by the patient's family).  Yes   Patient/family informed of their freedom to choose among providers that offer the needed level of care, that participate in Medicare, Medicaid or managed care program needed by the patient, have an available bed and are willing to accept the patient.  Yes   Patient/family informed of [facility]'s ownership interest in [facility] and [facility], as well as of the fact that they are under no obligation to receive care at these facilities.  PASRR submitted to [employer] on       PASRR number received on       Existing PASRR number confirmed on       FL2 transmitted to all facilities in geographic area requested by pt/family on       FL2 transmitted to all facilities within larger geographic area on       Patient informed that his/her managed care company has contracts with or will negotiate with certain facilities, including the following:        Yes   Patient/family informed of bed offers received.  Patient chooses bed at  [facility])     Physician recommends and patient chooses bed at      Patient to be transferred to  ([facility]) on 04/02/16.  Patient to be transferred to facility by  [facility])     Patient family notified on 04/02/16 of transfer.  Name of family member notified:   (April)     PHYSICIAN       Additional Comment:    _______________________________________________ Tilda Burrow R 04/02/2016, 3:09 PM

## 2016-04-02 NOTE — Discharge Summary (Signed)
Physician Discharge Summary  Danielle Sullivan [id number] DOB: 02/13/1956 DOA: 03/28/2016  PCP: Pcp Not In System  Admit date: 03/28/2016 Discharge date: 04/02/2016  Admitted From: Home  Disposition: SNF  Recommendations for Outpatient Follow-up:  1. Follow up with PCP in 1-2 weeks 2. Please obtain BMP/CBC in one week 3. Needs to follow up with Dr Ninfa Linden for schedule of hip sx.     Discharge Condition: stable.  CODE STATUS: Full Code.  Diet recommendation:  Carb Modified  Brief/Interim Summary:  Danielle Sullivan a 60 y.o.femalewith history of diabetes mellitus and tobacco abuse had a fall last evening at home while walking in the kitchen. Patient states she lost balance and fell. Denies hitting her head or losing consciousness. She did bruise her right shoulder. X-rays in the ER shows right hip fracture and on-call orthopedic surgeon Dr. Ninfa Linden was consulted. Patient is being admitted for further management of right hip fracture. Denies any chest pain or shortness of breath.   Assessment & Plan:   Principal Problem:   Hip fracture ([place]) Active Problems:   Diabetes mellitus type 2 in obese [facility])   Closed fracture of right hip (HCC)  Right hip fracture status post mechanical fall;  Dr Ninfa Linden consulted, he think fracture is more chronic, failure of hardware. Plan for sx in few weeks. Please follow up with Dr Ninfa Linden.  Lovenox for  for DVT prophylaxis.  Bowel regimen.  Pain management with , Vicodin.  Plan for sx in few weeks.  Need, SNF.  Awaiting SNF  Nausea, vomiting; KUB negative, Lipase, LFT negative Zofran PRN> Resolved.   Diabetes mellitus type 2; Lantus 10 units daily , adjust as needed.   Leukocytosis- probably reactionary. UA negative. Patient is afebrile. Follow trend.  Chest x ray negative for PNA.  Trending down.     Tobacco abuse - tobacco cessation counseling requested.    Discharge Diagnoses:  Principal Problem:   Hip  fracture ([place]) Active Problems:   Diabetes mellitus type 2 in obese [facility])   Closed fracture of right hip [facility])    Discharge Instructions  Discharge Instructions    Diet - low sodium heart healthy    Complete by:  As directed    Increase activity slowly    Complete by:  As directed        Medication List    TAKE these medications   bisacodyl 10 MG suppository Commonly known as:  DULCOLAX Place 1 suppository (10 mg total) rectally daily as needed for moderate constipation.   enoxaparin 40 MG/0.4ML injection Commonly known as:  LOVENOX Inject 0.4 mLs (40 mg total) into the skin daily.   feeding supplement (GLUCERNA SHAKE) Liqd Take 237 mLs by mouth 3 (three) times daily between meals.   HYDROcodone-acetaminophen 5-325 MG tablet Commonly known as:  NORCO/VICODIN Take 1-2 tablets by mouth every 6 (six) hours as needed for moderate pain.   insulin glargine 100 UNIT/ML injection Commonly known as:  LANTUS Inject 0.1 mLs (10 Units total) into the skin at bedtime.   MAGNESIUM PO Take 1 tablet by mouth daily.   multivitamin-lutein Caps capsule Take 1 capsule by mouth daily.   NUTRA-SUPPORT BONE Caps Take 1 tablet by mouth daily.   polyethylene glycol packet Commonly known as:  MIRALAX / GLYCOLAX Take 17 g by mouth daily. Start taking on:  04/03/2016   VITA-C PO Take 1 tablet by mouth daily.   VITAMIN A PO Take 1 tablet by mouth daily.   VITAMIN E PO Take 1  tablet by mouth daily.       Allergies  Allergen Reactions  . Pine Rash    Consultations:  Dr Ninfa Linden   Procedures/Studies: Dg Chest 1 View  Result Date: 03/28/2016 CLINICAL DATA:  Acute onset of shortness of breath. Preoperative radiograph at the right hip. Initial encounter. EXAM: CHEST 1 VIEW COMPARISON:  None. FINDINGS: The lungs are well-aerated. Mild vascular congestion is noted. Mildly increased interstitial markings may reflect mild interstitial edema. There is no evidence of pleural  effusion or pneumothorax. The cardiomediastinal silhouette is borderline normal in size. No acute osseous abnormalities are seen. IMPRESSION: Mild vascular congestion noted. Mildly increased interstitial markings may reflect mild interstitial edema. Electronically Signed   By: Garald Balding M.D.   On: 03/28/2016 23:17   Dg Abd 1 View  Result Date: 03/30/2016 CLINICAL DATA:  General abdominal pain with nausea and vomiting for a few days. EXAM: ABDOMEN - 1 VIEW COMPARISON:  CT of the right hip 03/29/2016. FINDINGS: There is a non obstructive bowel gas pattern. No supine evidence of free air. No organomegaly or suspicious calcification.No acute bony abnormality. Hardware noted in the proximal right femur with fractured femoral neck screw, stable since prior CT with femoral neck fracture again noted, stable. This may represent nonunion. IMPRESSION: No acute findings.  No evidence of bowel obstruction or free air. Electronically Signed   By: Rolm Baptise M.D.   On: 03/30/2016 11:36   Ct Hip Right Wo Contrast  Result Date: 03/29/2016 CLINICAL DATA:  Status post fall in kitchen, with right hip pain. Initial encounter. EXAM: CT OF THE RIGHT HIP WITHOUT CONTRAST TECHNIQUE: Multidetector CT imaging of the right hip was performed according to the standard protocol. Multiplanar CT image reconstructions were also generated. COMPARISON:  None. FINDINGS: Bones/Joint/Cartilage There is a screw fracture through the medial aspect of the patient's right femoral neck screw, with an associated transcervical fracture through the right femoral neck. The medial portion of the screw remains at the right femoral head. There is superior displacement of the distal femur. There is also loosening involving the distal aspect of the patient's intramedullary nail, measuring up to 5 mm medially. There appears to be a chronically displaced greater trochanteric fragment posterior and medial to the proximal femur. The right femoral head  remains seated at the acetabulum. A long osseous fragment is noted inferior to the right femoral neck, of uncertain significance. This may reflect heterotopic bone formation. Ligaments Suboptimally assessed by CT. Muscles and Tendons The visualized musculature is grossly unremarkable in appearance, aside from mild atrophy at the proximal right hamstring. No definite tendon abnormalities are characterized. Soft tissues Scattered vascular calcifications are seen. A small right inguinal hernia is noted, containing only fat. The bladder is moderately distended and grossly unremarkable. Visualized small and large bowel loops are grossly unremarkable. IMPRESSION: 1. Screw fracture through the medial aspect of the right femoral neck screw, with an associated transcervical fracture through the right femoral neck. The medial portion of the screw remains at the right femoral head. Superior displacement of the distal femur. 2. Loosening involving the distal aspect of the right femoral intramedullary nail, measuring up to 5 mm medially. 3. Chronically displaced greater trochanteric fragment noted posterior and medial to the proximal femur. Long osseous fragment inferior to the right femoral neck may reflect heterotopic bone formation. 4. Small right inguinal hernia, containing only fat. 5. Scattered vascular calcifications seen. Electronically Signed   By: Garald Balding M.D.   On: 03/29/2016 02:15  Dg Hip Unilat  With Pelvis 2-3 Views Right  Result Date: 03/28/2016 CLINICAL DATA:  Status post fall with right hip pain. EXAM: DG HIP (WITH OR WITHOUT PELVIS) 2-3V RIGHT COMPARISON:  None. FINDINGS: There is a right femoral nail and proximal compression screw. There is a fracture of the compression screw and transcervical fracture of the cervical neck with mild superior displacement of the femoral shaft in coxa vera angulation. Addition, there is a irregularity within the left superior pubic ramus which may represent a  nondisplaced fracture. No other fracture is identified. IMPRESSION: Right proximal femur transcervical neck fracture involving bone and compression screw with mild superior displacement of the femoral shaft and coxa vera angulation. Possible nondisplaced fracture of left superior pubic ramus. Electronically Signed   By: Kristine Garbe M.D.   On: 03/28/2016 22:58       Subjective: Doing ok, pain well controlled.   Discharge Exam: Vitals:   04/01/16 2005 04/02/16 0647  BP: (!) 156/67 130/63  Pulse: 92 86  Resp:    Temp: 98.3 F (36.8 C)    Vitals:   04/01/16 0419 04/01/16 1436 04/01/16 2005 04/02/16 0647  BP: (!) 152/66 (!) 146/64 (!) 156/67 130/63  Pulse: 75 82 92 86  Resp:  16    Temp: 98.3 F (36.8 C) 98.5 F (36.9 C) 98.3 F (36.8 C)   TempSrc: Oral Oral Oral   SpO2: 98% 96% 95% 95%  Weight:      Height:        General: Pt is alert, awake, not in acute distress Cardiovascular: RRR, S1/S2 +, no rubs, no gallops Respiratory: CTA bilaterally, no wheezing, no rhonchi Abdominal: Soft, NT, ND, bowel sounds + Extremities: no edema, no cyanosis    The results of significant diagnostics from this hospitalization (including imaging, microbiology, ancillary and laboratory) are listed below for reference.     Microbiology: No results found for this or any previous visit (from the past 240 hour(s)).   Labs: BNP (last 3 results) No results for input(s): BNP in the last 8760 hours. Basic Metabolic Panel:  Recent Labs Lab 03/28/16 2322 03/29/16 0436  NA 136 136  K 5.0 4.0  CL 100* 101  CO2 27 26  GLUCOSE 194* 155*  BUN 19 16  CREATININE 0.67 0.57  CALCIUM 9.4 9.0   Liver Function Tests:  Recent Labs Lab 03/30/16 0654  AST 21  ALT 16  ALKPHOS 56  BILITOT 0.3  PROT 6.5  ALBUMIN 3.4*    Recent Labs Lab 03/30/16 0654  LIPASE 20   No results for input(s): AMMONIA in the last 168 hours. CBC:  Recent Labs Lab 03/28/16 2322 03/29/16 0436  03/30/16 0654  WBC 18.0* 16.2* 13.7*  NEUTROABS 15.4*  --   --   HGB 14.3 13.4 13.2  HCT 43.2 41.4 40.1  MCV 91.3 91.2 92.0  PLT 324 338 308   Cardiac Enzymes: No results for input(s): CKTOTAL, CKMB, CKMBINDEX, TROPONINI in the last 168 hours. BNP: Invalid input(s): POCBNP CBG:  Recent Labs Lab 04/01/16 1131 04/01/16 1634 04/01/16 2218 04/02/16 0746 04/02/16 1230  GLUCAP 221* 190* 177* 165* 257*   D-Dimer No results for input(s): DDIMER in the last 72 hours. Hgb A1c No results for input(s): HGBA1C in the last 72 hours. Lipid Profile No results for input(s): CHOL, HDL, LDLCALC, TRIG, CHOLHDL, LDLDIRECT in the last 72 hours. Thyroid function studies No results for input(s): TSH, T4TOTAL, T3FREE, THYROIDAB in the last 72 hours.  Invalid input(s):  FREET3 Anemia work up No results for input(s): VITAMINB12, FOLATE, FERRITIN, TIBC, IRON, RETICCTPCT in the last 72 hours. Urinalysis    Component Value Date/Time   COLORURINE YELLOW 03/29/2016 0523   APPEARANCEUR CLOUDY (A) 03/29/2016 0523   LABSPEC 1.022 03/29/2016 0523   PHURINE 5.5 03/29/2016 0523   GLUCOSEU 250 (A) 03/29/2016 0523   HGBUR TRACE (A) 03/29/2016 0523   BILIRUBINUR NEGATIVE 03/29/2016 0523   KETONESUR NEGATIVE 03/29/2016 0523   PROTEINUR NEGATIVE 03/29/2016 0523   NITRITE NEGATIVE 03/29/2016 0523   LEUKOCYTESUR NEGATIVE 03/29/2016 0523   Sepsis Labs Invalid input(s): PROCALCITONIN,  WBC,  LACTICIDVEN Microbiology No results found for this or any previous visit (from the past 240 hour(s)).   Time coordinating discharge: Over 30 minutes  SIGNED:   Elmarie Shiley, MD  [facility] 04/02/2016, 1:21 PM Pager [phone]  If 7PM-7AM, please contact night-coverage [email or web address] Password [email or web address]

## 2016-04-02 NOTE — Progress Notes (Signed)
[facility] Cancellation Note  Patient Details Name: Danielle Sullivan MRN: [id number] DOB: 26-Oct-1955   Cancelled Treatment:    Reason Eval/Treat Not Completed: Other (comment) (Report called to SNF with [facility] transportation scheduled for 3:45 pm, will defer tx at this time.  )   [name] [name] 04/02/2016, 3:43 PM [name], PTA pager [phone]

## 2016-04-02 NOTE — Progress Notes (Signed)
SW spoke with patient at bedside and the pt's sister April via phone. SW spoke with both and informed them that the pt has Medicaid for insurance and that traditionally does not cover SNF.   Patient and sister state that they are interested in [facility] in [place]. SW reached out to [facility] and spoke with Devvie who is the admissions coordinator. She states that the pt has been accepted and is welcome to come. She states that they will be able to work with the patient's insurance alone with her SSI check.  SW made family and patient aware who agree that they would like pt to go to facility. They state if necessary the facility can take the pt's check in order for her to stay.   Sister states that the pt does not have any support at home who would be able to assist her with home health. Sister states that the mother who lives with the pt is elderly and unable to assist and that the sister who stays with the pt has a disability and will not be able to assist.   SW made devvie/Admissions and sister aware that transportation will be arranged with [facility]. Devvie states that she would like the patient to come around 3:45. SW will make arrangements for [facility]. SW made nurse aware.  PASRR: [id number]  Nurse Report Number : [phone]  Tilda Burrow, MSW [phone] 04/02/2016 2:47 PM

## 2016-04-12 ENCOUNTER — Other Ambulatory Visit: Payer: Self-pay | Admitting: Orthopaedic Surgery

## 2016-04-12 ENCOUNTER — Encounter (HOSPITAL_COMMUNITY): Payer: Self-pay | Admitting: *Deleted

## 2016-04-19 ENCOUNTER — Encounter (HOSPITAL_COMMUNITY): Payer: Self-pay | Admitting: *Deleted

## 2016-04-19 NOTE — Progress Notes (Addendum)
SPOKE WITH DR Lissa Hoard ANESTHESIA, PT IS TO TAKE NO MORE LOVENOX PRIOR TO SURGERY, SPOKE WITH PT SISTER April MILLER  AND AWARE OF SURGERY TIME AND PLACE, PT SIGNS OWN CONSENTS PER SISTER the patient SISTER TO ARRIVE AT 11 30 AM.

## 2016-04-19 NOTE — Progress Notes (Signed)
Pre op instructions faxed to [facility] [place], spoke with [employer] all pre op instructions received and understood, stated may not be able to do chlorohexadine showers, told her to send note for short stay if unable to do showers

## 2016-04-19 NOTE — Progress Notes (Signed)
Informed Nursing Facility that pt needs to arrive at 0930 and go to admitting. Faxed pre-op instructions.

## 2016-04-19 NOTE — Progress Notes (Addendum)
Preop instructions for:   Danielle Sullivan                      Date of Birth 03-19-1966                        Date of Procedure 04-20-16:       Doctor:.C blackman Time to arrive at [facility]: 1045 AM Report to: Admitting Procedure time: Procedure: RIGHT TOTAL HIP ARTHROPLASTY ANTERIOR APPROACH, AND REMOVAL OF OF ROD RIGHT HIP SCREW  Do not eatpast midnight the night before your procedure.(To include any tube feedings-must be discontinued), MAY HAVE CLEAR LIQUIDS FROM MIDNIGHT TONIGHT UNTIL 0700 AM, THEN NOTHING BY MOUTH.  Take these morning medications only with sips of water.(or give through gastrostomy or feeding tube).HYDROCODONE IF NEEDED, TAKE 1/2 DOSE BEDTIME LANTUS INSULIN TONIGHT 04-19-16.NO MORE LOVENOX IS TO BE GIVEN PER ANESTHESIA DR GERMEROTH.   SEE [place] PREPARING FOR SURGERY INSTRUCTION SHEET. Note: No Insulin or Diabetic meds should be given or taken the morning of the procedure!   Facility contact:     [facility]              Phone:  [phone]                Health Care POA: PT SIGNS OWN CONSENTS PER SISTER April MILLER  Transportation contact phone#: [phone] FACILITY TO BRING PT, SISTER APRIL MILLER COMING AROUND 1130 AM  Please send day of procedure:current med list and meds last taken that day, confirm nothing by mouth status from what time, Patient Demographic info( to include DNR status, problem list, allergies)   RN contact name/phone#:  ASHLEY [phone]                           and Fax [phone]  Bring Insurance card and picture ID Leave all jewelry and other valuables at place where living( no metal or rings to be worn) No contact lens Women-no make-up, no lotions,perfumes,powders Men-no colognes,lotions  Any questions day of procedure,callSHORT STAY [phone]!   Sent from :[facility]                   Phone:[phone]                  Fax:[phone]  Sent by :Michele Rockers [facility]

## 2016-04-20 ENCOUNTER — Encounter (HOSPITAL_COMMUNITY): Payer: Self-pay | Admitting: *Deleted

## 2016-04-20 ENCOUNTER — Inpatient Hospital Stay (HOSPITAL_COMMUNITY): Payer: Medicaid Other

## 2016-04-20 ENCOUNTER — Inpatient Hospital Stay (HOSPITAL_COMMUNITY): Payer: Medicaid Other | Admitting: Certified Registered Nurse Anesthetist

## 2016-04-20 ENCOUNTER — Encounter (HOSPITAL_COMMUNITY)
Admission: RE | Disposition: A | Discharge status: Skilled Nursing Facility | Payer: Self-pay | Source: Ambulatory Visit | Attending: Orthopaedic Surgery

## 2016-04-20 ENCOUNTER — Inpatient Hospital Stay (HOSPITAL_COMMUNITY)
Admission: RE | Admit: 2016-04-20 | Discharge: 2016-04-23 | DRG: 467 | Disposition: A | Discharge status: Skilled Nursing Facility | Payer: Medicaid Other | Source: Ambulatory Visit | Attending: Orthopaedic Surgery | Admitting: Orthopaedic Surgery

## 2016-04-20 DIAGNOSIS — E119 Type 2 diabetes mellitus without complications: Secondary | ICD-10-CM | POA: Diagnosis present

## 2016-04-20 DIAGNOSIS — M12551 Traumatic arthropathy, right hip: Secondary | ICD-10-CM | POA: Diagnosis not present

## 2016-04-20 DIAGNOSIS — Z01818 Encounter for other preprocedural examination: Secondary | ICD-10-CM

## 2016-04-20 DIAGNOSIS — Z6834 Body mass index (BMI) 34.0-34.9, adult: Secondary | ICD-10-CM | POA: Diagnosis not present

## 2016-04-20 DIAGNOSIS — Z419 Encounter for procedure for purposes other than remedying health state, unspecified: Secondary | ICD-10-CM

## 2016-04-20 DIAGNOSIS — M25551 Pain in right hip: Secondary | ICD-10-CM | POA: Diagnosis present

## 2016-04-20 DIAGNOSIS — Y848 Other medical procedures as the cause of abnormal reaction of the patient, or of later complication, without mention of misadventure at the time of the procedure: Secondary | ICD-10-CM | POA: Diagnosis present

## 2016-04-20 DIAGNOSIS — M96661 Fracture of femur following insertion of orthopedic implant, joint prosthesis, or bone plate, right leg: Secondary | ICD-10-CM | POA: Diagnosis present

## 2016-04-20 DIAGNOSIS — Z794 Long term (current) use of insulin: Secondary | ICD-10-CM

## 2016-04-20 DIAGNOSIS — D62 Acute posthemorrhagic anemia: Secondary | ICD-10-CM | POA: Diagnosis not present

## 2016-04-20 DIAGNOSIS — F17210 Nicotine dependence, cigarettes, uncomplicated: Secondary | ICD-10-CM | POA: Diagnosis present

## 2016-04-20 DIAGNOSIS — T84114A Breakdown (mechanical) of internal fixation device of right femur, initial encounter: Principal | ICD-10-CM | POA: Diagnosis present

## 2016-04-20 DIAGNOSIS — E669 Obesity, unspecified: Secondary | ICD-10-CM | POA: Diagnosis present

## 2016-04-20 DIAGNOSIS — Z833 Family history of diabetes mellitus: Secondary | ICD-10-CM

## 2016-04-20 DIAGNOSIS — Z91048 Other nonmedicinal substance allergy status: Secondary | ICD-10-CM

## 2016-04-20 DIAGNOSIS — S72041K Displaced fracture of base of neck of right femur, subsequent encounter for closed fracture with nonunion: Secondary | ICD-10-CM

## 2016-04-20 DIAGNOSIS — Z96641 Presence of right artificial hip joint: Secondary | ICD-10-CM

## 2016-04-20 DIAGNOSIS — S72001A Fracture of unspecified part of neck of right femur, initial encounter for closed fracture: Secondary | ICD-10-CM | POA: Diagnosis present

## 2016-04-20 HISTORY — DX: Displaced fracture of base of neck of right femur, initial encounter for closed fracture: S72.041A

## 2016-04-20 HISTORY — DX: History of falling: Z91.81

## 2016-04-20 HISTORY — DX: Muscle weakness (generalized): M62.81

## 2016-04-20 HISTORY — PX: TOTAL HIP ARTHROPLASTY: SHX124

## 2016-04-20 HISTORY — DX: Obesity, unspecified: E66.9

## 2016-04-20 HISTORY — DX: Nicotine dependence, unspecified, uncomplicated: F17.200

## 2016-04-20 HISTORY — PX: HARDWARE REMOVAL: SHX979

## 2016-04-20 LAB — TYPE AND SCREEN
ABO/RH(D): B POS
Antibody Screen: NEGATIVE

## 2016-04-20 LAB — GLUCOSE, CAPILLARY
GLUCOSE-CAPILLARY: 131 mg/dL — AB (ref 65–99)
GLUCOSE-CAPILLARY: 301 mg/dL — AB (ref 65–99)
GLUCOSE-CAPILLARY: 289 mg/dL — AB (ref 65–99)
Glucose-Capillary: 187 mg/dL — ABNORMAL HIGH (ref 65–99)

## 2016-04-20 LAB — ABO/RH: ABO/RH(D): B POS

## 2016-04-20 LAB — SURGICAL PCR SCREEN
MRSA, PCR: NEGATIVE
Staphylococcus aureus: POSITIVE — AB

## 2016-04-20 SURGERY — ARTHROPLASTY, HIP, TOTAL, ANTERIOR APPROACH
Anesthesia: General | Site: Hip | Laterality: Right

## 2016-04-20 MED ORDER — PHENOL 1.4 % MT LIQD: 1.0000 | OROMUCOSAL | Status: DC | PRN

## 2016-04-20 MED ORDER — VITAMIN E 180 MG (400 UNIT) PO CAPS
400.0000 [IU] | ORAL_CAPSULE | Freq: Every day | ORAL | Status: DC
  Administered 2016-04-21 – 2016-04-23 (×3): 400 [IU] via ORAL
  Filled 2016-04-20 (×3): qty 1

## 2016-04-20 MED ORDER — ONDANSETRON HCL 4 MG/2ML IJ SOLN
INTRAMUSCULAR | Status: DC | PRN
  Administered 2016-04-20: 4 mg via INTRAVENOUS

## 2016-04-20 MED ORDER — PHENYLEPHRINE HCL 10 MG/ML IJ SOLN
INTRAMUSCULAR | Status: DC | PRN
  Administered 2016-04-20 (×2): 40 ug via INTRAVENOUS

## 2016-04-20 MED ORDER — 0.9 % SODIUM CHLORIDE (POUR BTL) OPTIME
TOPICAL | Status: DC | PRN
  Administered 2016-04-20: 1000 mL

## 2016-04-20 MED ORDER — HYDROMORPHONE HCL 1 MG/ML IJ SOLN: 0.2500 mg | INTRAMUSCULAR | Status: DC | PRN

## 2016-04-20 MED ORDER — MIDAZOLAM HCL 2 MG/2ML IJ SOLN
INTRAMUSCULAR | Status: AC
  Filled 2016-04-20: qty 2

## 2016-04-20 MED ORDER — OXYCODONE HCL 5 MG PO TABS
5.0000 mg | ORAL_TABLET | ORAL | Status: DC | PRN
  Administered 2016-04-20 – 2016-04-21 (×5): 5 mg via ORAL
  Administered 2016-04-22 – 2016-04-23 (×5): 10 mg via ORAL
  Filled 2016-04-20 (×2): qty 1
  Filled 2016-04-20 (×2): qty 2
  Filled 2016-04-20: qty 1
  Filled 2016-04-20 (×2): qty 2
  Filled 2016-04-20 (×2): qty 1
  Filled 2016-04-20: qty 2

## 2016-04-20 MED ORDER — MIDAZOLAM HCL 5 MG/5ML IJ SOLN
INTRAMUSCULAR | Status: DC | PRN
  Administered 2016-04-20 (×2): 0.5 mg via INTRAVENOUS

## 2016-04-20 MED ORDER — PROSIGHT PO TABS
1.0000 | ORAL_TABLET | Freq: Every day | ORAL | Status: DC
  Administered 2016-04-21 – 2016-04-23 (×3): 1 via ORAL
  Filled 2016-04-20 (×3): qty 1

## 2016-04-20 MED ORDER — KETOROLAC TROMETHAMINE 30 MG/ML IJ SOLN
INTRAMUSCULAR | Status: AC
  Filled 2016-04-20: qty 1

## 2016-04-20 MED ORDER — PROPOFOL 10 MG/ML IV BOLUS
INTRAVENOUS | Status: AC
  Filled 2016-04-20: qty 40

## 2016-04-20 MED ORDER — PROMETHAZINE HCL 25 MG/ML IJ SOLN
INTRAMUSCULAR | Status: AC
  Administered 2016-04-20: 6.25 mg via INTRAVENOUS
  Filled 2016-04-20: qty 1

## 2016-04-20 MED ORDER — METOCLOPRAMIDE HCL 5 MG/ML IJ SOLN: 5.0000 mg | Freq: Three times a day (TID) | INTRAMUSCULAR | Status: DC | PRN

## 2016-04-20 MED ORDER — ACETAMINOPHEN 325 MG PO TABS: 650.0000 mg | ORAL_TABLET | Freq: Four times a day (QID) | ORAL | Status: DC | PRN

## 2016-04-20 MED ORDER — VITAMIN C 500 MG PO TABS
250.0000 mg | ORAL_TABLET | Freq: Every day | ORAL | Status: DC
  Administered 2016-04-21 – 2016-04-23 (×3): 250 mg via ORAL
  Filled 2016-04-20 (×3): qty 1

## 2016-04-20 MED ORDER — FENTANYL CITRATE (PF) 100 MCG/2ML IJ SOLN
INTRAMUSCULAR | Status: AC
  Filled 2016-04-20: qty 2

## 2016-04-20 MED ORDER — NICOTINE 14 MG/24HR TD PT24
14.0000 mg | MEDICATED_PATCH | Freq: Every day | TRANSDERMAL | Status: DC
  Administered 2016-04-20 – 2016-04-23 (×4): 14 mg via TRANSDERMAL
  Filled 2016-04-20 (×5): qty 1

## 2016-04-20 MED ORDER — PHENYLEPHRINE 40 MCG/ML (10ML) SYRINGE FOR IV PUSH (FOR BLOOD PRESSURE SUPPORT)
PREFILLED_SYRINGE | INTRAVENOUS | Status: AC
  Filled 2016-04-20: qty 10

## 2016-04-20 MED ORDER — TRANEXAMIC ACID 1000 MG/10ML IV SOLN
1000.0000 mg | INTRAVENOUS | Status: AC
  Administered 2016-04-20: 1000 mg via INTRAVENOUS
  Filled 2016-04-20: qty 1100

## 2016-04-20 MED ORDER — PROMETHAZINE HCL 25 MG/ML IJ SOLN
6.2500 mg | INTRAMUSCULAR | Status: DC | PRN
  Administered 2016-04-20: 6.25 mg via INTRAVENOUS

## 2016-04-20 MED ORDER — PROPOFOL 10 MG/ML IV BOLUS
INTRAVENOUS | Status: DC | PRN
  Administered 2016-04-20: 120 mg via INTRAVENOUS

## 2016-04-20 MED ORDER — FENTANYL CITRATE (PF) 100 MCG/2ML IJ SOLN
INTRAMUSCULAR | Status: DC | PRN
  Administered 2016-04-20 (×2): 50 ug via INTRAVENOUS
  Administered 2016-04-20: 100 ug via INTRAVENOUS

## 2016-04-20 MED ORDER — DEXTROSE 5 % IV SOLN
500.0000 mg | Freq: Four times a day (QID) | INTRAVENOUS | Status: DC | PRN
  Administered 2016-04-20: 500 mg via INTRAVENOUS
  Filled 2016-04-20 (×2): qty 5

## 2016-04-20 MED ORDER — MENTHOL 3 MG MT LOZG
1.0000 | LOZENGE | OROMUCOSAL | Status: DC | PRN
  Filled 2016-04-20: qty 9

## 2016-04-20 MED ORDER — SODIUM CHLORIDE 0.9 % IV SOLN
INTRAVENOUS | Status: DC
  Administered 2016-04-21: 01:00:00 via INTRAVENOUS

## 2016-04-20 MED ORDER — CEFAZOLIN IN D5W 1 GM/50ML IV SOLN
1.0000 g | Freq: Four times a day (QID) | INTRAVENOUS | Status: AC
  Administered 2016-04-20 – 2016-04-21 (×2): 1 g via INTRAVENOUS
  Filled 2016-04-20 (×2): qty 50

## 2016-04-20 MED ORDER — CEFAZOLIN SODIUM-DEXTROSE 2-4 GM/100ML-% IV SOLN
2.0000 g | INTRAVENOUS | Status: AC
  Administered 2016-04-20 (×2): 2 g via INTRAVENOUS
  Filled 2016-04-20: qty 100

## 2016-04-20 MED ORDER — MAGNESIUM OXIDE 400 (241.3 MG) MG PO TABS
400.0000 mg | ORAL_TABLET | Freq: Every day | ORAL | Status: DC
  Administered 2016-04-21 – 2016-04-23 (×3): 400 mg via ORAL
  Filled 2016-04-20 (×3): qty 1

## 2016-04-20 MED ORDER — HYDROMORPHONE HCL 1 MG/ML IJ SOLN
1.0000 mg | INTRAMUSCULAR | Status: DC | PRN
  Administered 2016-04-20: 1 mg via INTRAVENOUS
  Filled 2016-04-20: qty 1

## 2016-04-20 MED ORDER — ACETAMINOPHEN 650 MG RE SUPP: 650.0000 mg | Freq: Four times a day (QID) | RECTAL | Status: DC | PRN

## 2016-04-20 MED ORDER — CEFAZOLIN SODIUM-DEXTROSE 2-4 GM/100ML-% IV SOLN
INTRAVENOUS | Status: AC
  Filled 2016-04-20: qty 100

## 2016-04-20 MED ORDER — LACTATED RINGERS IV SOLN
INTRAVENOUS | Status: DC | PRN
  Administered 2016-04-20 (×3): via INTRAVENOUS

## 2016-04-20 MED ORDER — DIPHENHYDRAMINE HCL 12.5 MG/5ML PO ELIX: 12.5000 mg | ORAL_SOLUTION | ORAL | Status: DC | PRN

## 2016-04-20 MED ORDER — ACETAMINOPHEN 10 MG/ML IV SOLN
INTRAVENOUS | Status: DC | PRN
  Administered 2016-04-20: 1000 mg via INTRAVENOUS

## 2016-04-20 MED ORDER — LIDOCAINE HCL (CARDIAC) 20 MG/ML IV SOLN
INTRAVENOUS | Status: DC | PRN
  Administered 2016-04-20: 100 mg via INTRAVENOUS

## 2016-04-20 MED ORDER — LACTATED RINGERS IV SOLN
INTRAVENOUS | Status: DC
  Administered 2016-04-20: 11:00:00 via INTRAVENOUS

## 2016-04-20 MED ORDER — PROMETHAZINE HCL 25 MG/ML IJ SOLN: 6.2500 mg | INTRAMUSCULAR | Status: DC | PRN

## 2016-04-20 MED ORDER — ROCURONIUM BROMIDE 100 MG/10ML IV SOLN
INTRAVENOUS | Status: DC | PRN
  Administered 2016-04-20: 50 mg via INTRAVENOUS
  Administered 2016-04-20 (×2): 10 mg via INTRAVENOUS

## 2016-04-20 MED ORDER — METOCLOPRAMIDE HCL 5 MG PO TABS: 5.0000 mg | ORAL_TABLET | Freq: Three times a day (TID) | ORAL | Status: DC | PRN

## 2016-04-20 MED ORDER — INSULIN GLARGINE 100 UNIT/ML ~~LOC~~ SOLN
10.0000 [IU] | Freq: Every day | SUBCUTANEOUS | Status: DC
  Administered 2016-04-20 – 2016-04-22 (×3): 10 [IU] via SUBCUTANEOUS
  Filled 2016-04-20 (×4): qty 0.1

## 2016-04-20 MED ORDER — ASPIRIN 81 MG PO CHEW
81.0000 mg | CHEWABLE_TABLET | Freq: Two times a day (BID) | ORAL | Status: DC
  Administered 2016-04-21 – 2016-04-23 (×5): 81 mg via ORAL
  Filled 2016-04-20 (×5): qty 1

## 2016-04-20 MED ORDER — ONDANSETRON HCL 4 MG/2ML IJ SOLN
4.0000 mg | Freq: Four times a day (QID) | INTRAMUSCULAR | Status: DC | PRN
  Administered 2016-04-20 – 2016-04-21 (×2): 4 mg via INTRAVENOUS
  Filled 2016-04-20 (×2): qty 2

## 2016-04-20 MED ORDER — KETOROLAC TROMETHAMINE 30 MG/ML IJ SOLN: 30.0000 mg | Freq: Once | INTRAMUSCULAR | Status: DC

## 2016-04-20 MED ORDER — SUGAMMADEX SODIUM 500 MG/5ML IV SOLN
INTRAVENOUS | Status: DC | PRN
  Administered 2016-04-20: 300 mg via INTRAVENOUS

## 2016-04-20 MED ORDER — ONDANSETRON HCL 4 MG PO TABS
4.0000 mg | ORAL_TABLET | Freq: Four times a day (QID) | ORAL | Status: DC | PRN
  Administered 2016-04-22 – 2016-04-23 (×2): 4 mg via ORAL
  Filled 2016-04-20 (×2): qty 1

## 2016-04-20 MED ORDER — GLUCERNA SHAKE PO LIQD
237.0000 mL | Freq: Three times a day (TID) | ORAL | Status: DC
  Administered 2016-04-20 – 2016-04-23 (×8): 237 mL via ORAL
  Filled 2016-04-20 (×10): qty 237

## 2016-04-20 MED ORDER — SODIUM CHLORIDE 0.9 % IR SOLN
Status: DC | PRN
  Administered 2016-04-20: 1000 mL

## 2016-04-20 MED ORDER — KETOROLAC TROMETHAMINE 30 MG/ML IJ SOLN
30.0000 mg | Freq: Once | INTRAMUSCULAR | Status: AC
  Administered 2016-04-20: 30 mg via INTRAVENOUS

## 2016-04-20 MED ORDER — GLYCOPYRROLATE 0.2 MG/ML IJ SOLN
INTRAMUSCULAR | Status: DC | PRN
  Administered 2016-04-20: 0.2 mg via INTRAVENOUS

## 2016-04-20 MED ORDER — MEPERIDINE HCL 50 MG/ML IJ SOLN: 6.2500 mg | INTRAMUSCULAR | Status: DC | PRN

## 2016-04-20 MED ORDER — ACETAMINOPHEN 10 MG/ML IV SOLN
INTRAVENOUS | Status: AC
  Filled 2016-04-20: qty 100

## 2016-04-20 MED ORDER — EPHEDRINE 5 MG/ML INJ
INTRAVENOUS | Status: AC
  Filled 2016-04-20: qty 10

## 2016-04-20 MED ORDER — INSULIN ASPART 100 UNIT/ML ~~LOC~~ SOLN
0.0000 [IU] | Freq: Three times a day (TID) | SUBCUTANEOUS | Status: DC
  Administered 2016-04-21 (×3): 3 [IU] via SUBCUTANEOUS
  Administered 2016-04-22: 5 [IU] via SUBCUTANEOUS
  Administered 2016-04-22 – 2016-04-23 (×4): 3 [IU] via SUBCUTANEOUS

## 2016-04-20 MED ORDER — METHOCARBAMOL 500 MG PO TABS
500.0000 mg | ORAL_TABLET | Freq: Four times a day (QID) | ORAL | Status: DC | PRN
  Administered 2016-04-21 – 2016-04-23 (×4): 500 mg via ORAL
  Filled 2016-04-20 (×4): qty 1

## 2016-04-20 MED ORDER — SUGAMMADEX SODIUM 200 MG/2ML IV SOLN
INTRAVENOUS | Status: AC
  Filled 2016-04-20: qty 2

## 2016-04-20 MED ORDER — ALUM & MAG HYDROXIDE-SIMETH 200-200-20 MG/5ML PO SUSP: 30.0000 mL | ORAL | Status: DC | PRN

## 2016-04-20 MED ORDER — INSULIN ASPART 100 UNIT/ML ~~LOC~~ SOLN
4.0000 [IU] | Freq: Three times a day (TID) | SUBCUTANEOUS | Status: DC
  Administered 2016-04-21 – 2016-04-23 (×6): 4 [IU] via SUBCUTANEOUS

## 2016-04-20 MED ORDER — DOCUSATE SODIUM 100 MG PO CAPS
100.0000 mg | ORAL_CAPSULE | Freq: Two times a day (BID) | ORAL | Status: DC
  Administered 2016-04-20 – 2016-04-23 (×6): 100 mg via ORAL
  Filled 2016-04-20 (×6): qty 1

## 2016-04-20 SURGICAL SUPPLY — 52 items
BAG ZIPLOCK 12X15 (MISCELLANEOUS) ×3 IMPLANT
BANDAGE ACE 6X5 VEL STRL LF (GAUZE/BANDAGES/DRESSINGS) ×3 IMPLANT
BANDAGE ESMARK 6X9 LF (GAUZE/BANDAGES/DRESSINGS) ×1 IMPLANT
BENZOIN TINCTURE PRP APPL 2/3 (GAUZE/BANDAGES/DRESSINGS) IMPLANT
BLADE SAW SGTL 18X1.27X75 (BLADE) ×2 IMPLANT
BLADE SAW SGTL 18X1.27X75MM (BLADE) ×1
BNDG ESMARK 6X9 LF (GAUZE/BANDAGES/DRESSINGS) ×3
CAPT HIP TOTAL 2 ×3 IMPLANT
CELLS DAT CNTRL 66122 CELL SVR (MISCELLANEOUS) ×1 IMPLANT
CLOSURE WOUND 1/2 X4 (GAUZE/BANDAGES/DRESSINGS)
CLOTH BEACON ORANGE TIMEOUT ST (SAFETY) ×3 IMPLANT
CUFF TOURN SGL QUICK 34 (TOURNIQUET CUFF)
CUFF TRNQT CYL 34X4X40X1 (TOURNIQUET CUFF) IMPLANT
DRAPE POUCH INSTRU U-SHP 10X18 (DRAPES) ×3 IMPLANT
DRAPE STERI IOBAN 125X83 (DRAPES) IMPLANT
DRAPE U-SHAPE 47X51 STRL (DRAPES) ×6 IMPLANT
DRSG AQUACEL AG ADV 3.5X10 (GAUZE/BANDAGES/DRESSINGS) ×3 IMPLANT
DRSG PAD ABDOMINAL 8X10 ST (GAUZE/BANDAGES/DRESSINGS) IMPLANT
DURAPREP 26ML APPLICATOR (WOUND CARE) ×3 IMPLANT
ELECT REM PT RETURN 9FT ADLT (ELECTROSURGICAL) ×3
ELECTRODE REM PT RTRN 9FT ADLT (ELECTROSURGICAL) ×1 IMPLANT
GAUZE SPONGE 4X4 12PLY STRL (GAUZE/BANDAGES/DRESSINGS) IMPLANT
GAUZE XEROFORM 1X8 LF (GAUZE/BANDAGES/DRESSINGS) ×6 IMPLANT
GLOVE BIO SURGEON STRL SZ7.5 (GLOVE) ×3 IMPLANT
GLOVE BIOGEL PI IND STRL 8 (GLOVE) ×2 IMPLANT
GLOVE BIOGEL PI INDICATOR 8 (GLOVE) ×4
GLOVE ECLIPSE 8.0 STRL XLNG CF (GLOVE) ×3 IMPLANT
GOWN STRL REUS W/TWL XL LVL3 (GOWN DISPOSABLE) ×6 IMPLANT
HANDPIECE INTERPULSE COAX TIP (DISPOSABLE) ×2
HOLDER FOLEY CATH W/STRAP (MISCELLANEOUS) ×3 IMPLANT
KIT BASIN OR (CUSTOM PROCEDURE TRAY) ×3 IMPLANT
NS IRRIG 1000ML POUR BTL (IV SOLUTION) ×3 IMPLANT
PACK ANTERIOR HIP CUSTOM (KITS) ×6 IMPLANT
PACK TOTAL JOINT (CUSTOM PROCEDURE TRAY) IMPLANT
PADDING CAST COTTON 6X4 STRL (CAST SUPPLIES) IMPLANT
POSITIONER SURGICAL ARM (MISCELLANEOUS) IMPLANT
RTRCTR WOUND ALEXIS 18CM MED (MISCELLANEOUS) ×3
SET HNDPC FAN SPRY TIP SCT (DISPOSABLE) ×1 IMPLANT
STAPLER VISISTAT 35W (STAPLE) IMPLANT
STRIP CLOSURE SKIN 1/2X4 (GAUZE/BANDAGES/DRESSINGS) IMPLANT
SUT ETHIBOND NAB CT1 #1 30IN (SUTURE) ×3 IMPLANT
SUT MNCRL AB 4-0 PS2 18 (SUTURE) IMPLANT
SUT VIC AB 0 CT1 36 (SUTURE) ×6 IMPLANT
SUT VIC AB 1 CT1 27 (SUTURE) ×2
SUT VIC AB 1 CT1 27XBRD ANTBC (SUTURE) ×1 IMPLANT
SUT VIC AB 1 CT1 36 (SUTURE) ×6 IMPLANT
SUT VIC AB 2-0 CT1 27 (SUTURE) ×8
SUT VIC AB 2-0 CT1 TAPERPNT 27 (SUTURE) ×4 IMPLANT
TOWEL OR 17X26 10 PK STRL BLUE (TOWEL DISPOSABLE) IMPLANT
TRAY FOLEY W/METER SILVER 16FR (SET/KITS/TRAYS/PACK) ×3 IMPLANT
WATER STERILE IRR 1500ML POUR (IV SOLUTION) ×3 IMPLANT
YANKAUER SUCT BULB TIP NO VENT (SUCTIONS) ×3 IMPLANT

## 2016-04-20 NOTE — Anesthesia Procedure Notes (Signed)
Procedure Name: Intubation Date/Time: 04/20/2016 12:39 PM Performed by: Lyn Hollingshead Pre-anesthesia Checklist: Patient identified, Timeout performed, Emergency Drugs available, Suction available and Patient being monitored Patient Re-evaluated:Patient Re-evaluated prior to inductionOxygen Delivery Method: Circle system utilized Preoxygenation: Pre-oxygenation with 100% oxygen Intubation Type: IV induction and Cricoid Pressure applied Ventilation: Mask ventilation without difficulty Laryngoscope Size: Mac and 3 Grade View: Grade II Tube type: Oral Tube size: 7.5 mm Number of attempts: 1 Airway Equipment and Method: Stylet Placement Confirmation: ETT inserted through vocal cords under direct vision,  positive ETCO2 and breath sounds checked- equal and bilateral Secured at: 21 cm Tube secured with: Tape Dental Injury: Teeth and Oropharynx as per pre-operative assessment

## 2016-04-20 NOTE — Brief Op Note (Signed)
04/20/2016  3:12 PM  PATIENT:  Danielle Sullivan  60 y.o. 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   Delay start of Pharmacological VTE agent (>24hrs) due to surgical blood loss or risk of bleeding: no

## 2016-04-20 NOTE — Anesthesia Preprocedure Evaluation (Signed)
Anesthesia Evaluation  Patient identified by MRN, date of birth, ID band Patient awake    Reviewed: Allergy & Precautions, H&P , NPO status , Patient's Chart, lab work & pertinent test results  Airway Mallampati: II  TM Distance: >3 FB Neck ROM: full    Dental  (+) Edentulous Upper, Edentulous Lower   Pulmonary Current Smoker,    Pulmonary exam normal        Cardiovascular Exercise Tolerance: Good negative cardio ROS Normal cardiovascular exam     Neuro/Psych negative neurological ROS  negative psych ROS   GI/Hepatic negative GI ROS, Neg liver ROS,   Endo/Other  negative endocrine ROSdiabetes, Well Controlled, Insulin Dependent  Renal/GU negative Renal ROS  negative genitourinary   Musculoskeletal   Abdominal (+) + obese,   Peds  Hematology negative hematology ROS (+)   Anesthesia Other Findings   Reproductive/Obstetrics negative OB ROS                             Anesthesia Physical Anesthesia Plan  ASA: II  Anesthesia Plan: General   Post-op Pain Management:    Induction: Intravenous  Airway Management Planned: Oral ETT  Additional Equipment:   Intra-op Plan:   Post-operative Plan: Extubation in OR  Informed Consent: I have reviewed the patients History and Physical, chart, labs and discussed the procedure including the risks, benefits and alternatives for the proposed anesthesia with the patient or authorized representative who has indicated his/her understanding and acceptance.     Plan Discussed with: CRNA and Surgeon  Anesthesia Plan Comments:         Anesthesia Quick Evaluation

## 2016-04-20 NOTE — Transfer of Care (Signed)
Immediate Anesthesia Transfer of Care Note  Patient: Druscilla Orosco  Procedure(s) Performed: Procedure(s): RIGHT TOTAL HIP ARTHROPLASTY ANTERIOR APPROACH (Right) HARDWARE REMOVAL (Right)  Patient Location: PACU  Anesthesia Type:General  Level of Consciousness: awake, oriented, patient cooperative, lethargic and responds to stimulation  Airway & Oxygen Therapy: Patient Spontanous Breathing and Patient connected to face mask oxygen  Post-op Assessment: Report given to RN, Post -op Vital signs reviewed and stable and Patient moving all extremities  Post vital signs: Reviewed and stable  Last Vitals:  Vitals:   04/20/16 1024  BP: (!) 150/76  Pulse: 96  Resp: 16  Temp: 36.6 C    Last Pain:  Vitals:   04/20/16 1040  TempSrc:   PainSc: 4       Patients Stated Pain Goal: 4 ([id number])  Complications: No apparent anesthesia complications

## 2016-04-20 NOTE — H&P (Signed)
TOTAL HIP ADMISSION H&P  Patient is admitted for right total hip arthroplasty.  Subjective:  Chief Complaint: right hip pain  HPI: Danielle Sullivan, 60 y.o. female, has a history of pain and functional disability in the right hip(s) due to trauma, arthritis and and failure of previous right hip hardware (broken screw and non-union) and patient has failed non-surgical conservative treatment to include NSAID's and/or analgesics, supervised PT with diminished ADL's post treatment, use of assistive devices and activity modification.  Onset of symptoms was abrupt starting 1 years ago with rapidlly worsening course since that time.The patient noted prior procedures of the hip to include open reduction/internal fixation of her right hip in 2012 in [place] on the right hip(s).  Patient currently rates pain in the right hip at 10 out of 10 with activity. Patient has night pain, worsening of pain with activity and weight bearing, trendelenberg gait, pain that interfers with activities of daily living and pain with passive range of motion. 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 This condition presents safety issues increasing the risk of falls. This patient has had proximal femur fracture.  There is no current active infection.  Patient Active Problem List   Diagnosis Date Noted  . Hip fracture ([place]) 03/29/2016  . Diabetes mellitus type 2 in obese ([place]) 03/29/2016  . Closed fracture of right hip [facility])    Past Medical History:  Diagnosis Date  . Diabetes mellitus without complication ([place])    type II  . Displaced fracture of base of neck of right femur, initial encounter for closed fracture ([place])   . Hx of fall   . Muscle weakness (generalized)   . Nicotine dependence   . Obesity     Past Surgical History:  Procedure Laterality Date  . FRACTURE SURGERY  2012   right hip fx surgery    No  prescriptions prior to admission.   Allergies  Allergen Reactions  . Pine Rash    Social History  Substance Use Topics  . Smoking status: Current Every Day Smoker    Packs/day: 1.00    Types: Cigarettes  . Smokeless tobacco: Never Used  . Alcohol use No    Family History  Problem Relation Age of Onset  . Diabetes Sister      Review of Systems  All other systems reviewed and are negative.   Objective:  Physical Exam  Constitutional: She is oriented to person, place, and time. She appears well-developed and well-nourished.  HENT:  Head: Normocephalic and atraumatic.  Eyes: EOM are normal. Pupils are equal, round, and reactive to light.  Neck: Normal range of motion. Neck supple.  Cardiovascular: Normal rate.   Respiratory: Effort normal and breath sounds normal.  GI: Soft. Bowel sounds are normal.  Musculoskeletal:       Right hip: She exhibits decreased range of motion, decreased strength, tenderness and bony tenderness.  Neurological: She is alert and oriented to person, place, and time.  Skin: Skin is warm and dry.  Psychiatric: She has a normal mood and affect.    Vital signs in last 24 hours:    Labs:   Estimated body mass index is 32.12 kg/m as calculated from the following:   Height as of 03/28/16: 5\' 1"  (1.549 m).   Weight as of 03/28/16: 170 lb (77.1 kg).   Imaging Review Plain radiographs demonstrate a fracture non-union of the  right femoral neck with failure of her previous fixation and hardware breakage.  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  Risks and benefits have been discussed in detail.  Will likely need short-term skilled nursing placement following her acute hospital stay.

## 2016-04-21 LAB — CBC
HEMATOCRIT: 35.6 % — AB (ref 36.0–46.0)
HEMOGLOBIN: 11.3 g/dL — AB (ref 12.0–15.0)
MCH: 29.8 pg (ref 26.0–34.0)
MCHC: 31.7 g/dL (ref 30.0–36.0)
MCV: 93.9 fL (ref 78.0–100.0)
Platelets: 409 10*3/uL — ABNORMAL HIGH (ref 150–400)
RBC: 3.79 MIL/uL — AB (ref 3.87–5.11)
RDW: 14.3 % (ref 11.5–15.5)
WBC: 12.2 10*3/uL — ABNORMAL HIGH (ref 4.0–10.5)

## 2016-04-21 LAB — GLUCOSE, CAPILLARY
Glucose-Capillary: 170 mg/dL — ABNORMAL HIGH (ref 65–99)
Glucose-Capillary: 157 mg/dL — ABNORMAL HIGH (ref 65–99)
Glucose-Capillary: 191 mg/dL — ABNORMAL HIGH (ref 65–99)
Glucose-Capillary: 288 mg/dL — ABNORMAL HIGH (ref 65–99)

## 2016-04-21 LAB — BASIC METABOLIC PANEL
Anion gap: 7 (ref 5–15)
BUN: 16 mg/dL (ref 6–20)
CHLORIDE: 98 mmol/L — AB (ref 101–111)
CO2: 26 mmol/L (ref 22–32)
Calcium: 8.3 mg/dL — ABNORMAL LOW (ref 8.9–10.3)
Creatinine, Ser: 0.73 mg/dL (ref 0.44–1.00)
GFR calc non Af Amer: >60 mL/min (ref >60)
Glucose, Bld: 186 mg/dL — ABNORMAL HIGH (ref 65–99)
POTASSIUM: 4.5 mmol/L (ref 3.5–5.1)
Sodium: 131 mmol/L — ABNORMAL LOW (ref 135–145)

## 2016-04-21 LAB — HEMOGLOBIN A1C
Hgb A1c MFr Bld: 8.1 % — ABNORMAL HIGH (ref 4.8–5.6)
Mean Plasma Glucose: 186 mg/dL

## 2016-04-21 NOTE — Clinical Social Work Note (Addendum)
Clinical Social Work Assessment  Patient Details  Name: Danielle Sullivan MRN: [id number] Date of Birth: 06/03/1956  Date of referral:  04/21/16               Reason for consult:  Facility Placement (Return to SNF)                Permission sought to share information with:  Facility Contact Representative, Family Supports Permission granted to share information::  Yes, Verbal Permission Granted  Name::     Danielle Sullivan and any of her sisters- especially Danielle Sullivan       Housing/Transportation Living arrangements for the past 2 months:  Skilled Nursing Facility Source of Information:  Patient, Other (Comment Required) (Sister- Danielle) Patient Interpreter Needed:  None Criminal Activity/Legal Involvement Pertinent to Current Situation/Hospitalization:  No - Comment as needed Significant Relationships:  Siblings, Parents (Mom and sisters) Lives with:  Facility Resident (In short term rehab; prior to this was home with mother/2 sisters) Do you feel safe going back to the place where you live?  Yes Need for family participation in patient care:  Yes (Comment) (Pt is alert and oriented but is ok with sisters helping)  Care giving concerns: Current resident of SNF- Danielle  Siler Sullivan- new hip surgery with continued need for rehab. She is anxious to return home when she is stronger.   Social Worker assessment / plan: SW met with patient and per her permission spoke with her sister Danielle via phone. Pt states she feel at home about 4 weeks ago while washing dishes; she was unable to walk so went to rehab or care. She has had to return to the hospital for surgery as the pain has become too great and states she will require further rehab.  She feels she is satisfied with the care at the facility and wants to return there for further rehab.  Patient noted to be alert, oriented and very pleasant. Fl2 will be completed and placed on chart for MD's signature.   Employment status:  Disabled (Comment on  whether or not currently receiving Disability) Insurance information:  Medicaid In State PT Recommendations:  Skilled Nursing Facility Information / Referral to community resources:  Skilled Nursing Facility  Patient/Family's Response to care:  Pt feels she is doing much better since surgery and that her pain is reduced now. She feels she is ready to resume her rehab.  Patient/Family's Understanding of and Emotional Response to Diagnosis, Current Treatment, and Prognosis: Patient is able to verbalize a strong understanding of her current medical issues and needs; states that she was "very nervous" about her surgery but is so glad it is over with.  Pt noted to be very pleasant, laughing and joking with this SW during visit. States she is enjoying having visitors and "feels good".    Emotional Assessment Appearance:  Appears stated age Attitude/Demeanor/Rapport:   (appropriate for age and situation) Affect (typically observed):  Appropriate, Happy, Pleasant, Calm Orientation:  Oriented to Self, Oriented to Place, Oriented to  Time, Oriented to Situation Alcohol / Substance use:  Tobacco Use Psych involvement (Current and /or in the community):  No (Comment)  Discharge Needs  Concerns to be addressed:  Care Coordination Readmission within the last 30 days:  Yes Current discharge risk:  Dependent with Mobility Barriers to Discharge:   (Return to SNF when stable)   Crowder, Donna T, LCSW 04/21/2016,4:55 PM 

## 2016-04-21 NOTE — NC FL2 (Signed)
[place] LEVEL OF CARE SCREENING TOOL     IDENTIFICATION  Patient Name: Danielle Sullivan Birthdate: 23-Jun-1955 Sex: female Admission Date (Current Location): 03/28/2016  [facility] and [place] Number:  Herbalist and Address:  [facility], [place] [address] [place], [place] [address]      Provider Number: [id number]  Attending Physician Name and Address:  No att. providers found  Relative Name and Phone Number:       Current Level of Care: Hospital Recommended Level of Care: [place] Prior Approval Number:    Date Approved/Denied:   PASRR Number:    Discharge Plan: SNF    Current Diagnoses: Patient Active Problem List   Diagnosis Date Noted  . Status post total replacement of right hip 04/20/2016  . 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 Hip fracture ([place]) 03/29/2016  . Diabetes mellitus type 2 in obese ([place]) 03/29/2016  . Closed fracture of right hip ([place])     Orientation RESPIRATION BLADDER Height & Weight     Self, Time, Situation, Place  Normal Continent Weight: 170 lb (77.1 kg) Height:  5\' 1"  (154.9 cm)  BEHAVIORAL SYMPTOMS/MOOD NEUROLOGICAL BOWEL NUTRITION STATUS   (None)  (none) Continent  (carb modified)  AMBULATORY STATUS COMMUNICATION OF NEEDS Skin   Extensive Assist Verbally Normal                       Personal Care Assistance Level of Assistance  Bathing, Feeding, Dressing Bathing Assistance: Limited assistance Feeding assistance: Independent Dressing Assistance: Limited assistance     Functional Limitations Info  Sight, Hearing, Speech Sight Info: Adequate Hearing Info: Adequate Speech Info: Adequate    SPECIAL CARE FACTORS FREQUENCY  PT (By licensed PT), OT (By licensed OT)     PT Frequency:  (5x/week) OT Frequency:  (5x/week)            Contractures Contractures Info: Not present    Additional  Factors Info  Code Status, Allergies Code Status Info:  (Full) Allergies Info:  [place])           Current Medications (04/21/2016):  This is the current hospital active medication list No current facility-administered medications for this encounter.    No current outpatient prescriptions on file.   Facility-Administered Medications Ordered in Other Encounters  Medication Dose Route Frequency Provider Last Rate Last Dose  . 0.9 %  sodium chloride infusion   Intravenous Continuous Mcarthur Rossetti, MD 10 mL/hr at 04/21/16 1853    . acetaminophen (TYLENOL) tablet 650 mg  650 mg Oral Q6H PRN Mcarthur Rossetti, MD       Or  . acetaminophen (TYLENOL) suppository 650 mg  650 mg Rectal Q6H PRN Mcarthur Rossetti, MD      . alum & mag hydroxide-simeth (MAALOX/MYLANTA) 200-200-20 MG/5ML suspension 30 mL  30 mL Oral Q4H PRN Mcarthur Rossetti, MD      . aspirin chewable tablet 81 mg  81 mg Oral BID Mcarthur Rossetti, MD   81 mg at 04/21/16 0946  . diphenhydrAMINE (BENADRYL) 12.5 MG/5ML elixir 12.5-25 mg  12.5-25 mg Oral Q4H PRN Mcarthur Rossetti, MD      . docusate sodium (COLACE) capsule 100 mg  100 mg Oral BID Mcarthur Rossetti, MD   100 mg at 04/21/16 0946  . feeding supplement (GLUCERNA SHAKE) (GLUCERNA SHAKE) liquid 237  mL  237 mL Oral TID BM Mcarthur Rossetti, MD   237 mL at 04/21/16 1306  . HYDROmorphone (DILAUDID) injection 1 mg  1 mg Intravenous Q2H PRN Mcarthur Rossetti, MD   1 mg at 04/20/16 1758  . insulin aspart (novoLOG) injection 0-15 Units  0-15 Units Subcutaneous TID WC Mcarthur Rossetti, MD   3 Units at 04/21/16 1900  . insulin aspart (novoLOG) injection 4 Units  4 Units Subcutaneous TID WC Mcarthur Rossetti, MD   4 Units at 04/21/16 1901  . insulin glargine (LANTUS) injection 10 Units  10 Units Subcutaneous QHS Mcarthur Rossetti, MD   10 Units at 04/20/16 2149  . magnesium oxide (MAG-OX) tablet 400 mg  400 mg Oral Daily  Mcarthur Rossetti, MD   400 mg at 04/21/16 0946  . menthol-cetylpyridinium (CEPACOL) lozenge 3 mg  1 lozenge Oral PRN Mcarthur Rossetti, MD       Or  . phenol (CHLORASEPTIC) mouth spray 1 spray  1 spray Mouth/Throat PRN Mcarthur Rossetti, MD      . methocarbamol (ROBAXIN) tablet 500 mg  500 mg Oral Q6H PRN Mcarthur Rossetti, MD   500 mg at 04/21/16 1901   Or  . methocarbamol (ROBAXIN) 500 mg in dextrose 5 % 50 mL IVPB  500 mg Intravenous Q6H PRN Mcarthur Rossetti, MD   500 mg at 04/20/16 1630  . metoCLOPramide (REGLAN) tablet 5-10 mg  5-10 mg Oral Q8H PRN Mcarthur Rossetti, MD       Or  . metoCLOPramide (REGLAN) injection 5-10 mg  5-10 mg Intravenous Q8H PRN Mcarthur Rossetti, MD      . multivitamin (PROSIGHT) tablet 1 tablet  1 tablet Oral Daily Mcarthur Rossetti, MD   1 tablet at 04/21/16 0946  . nicotine (NICODERM CQ - dosed in mg/24 hours) patch 14 mg  14 mg Transdermal Daily Mcarthur Rossetti, MD   14 mg at 04/21/16 0951  . ondansetron (ZOFRAN) tablet 4 mg  4 mg Oral Q6H PRN Mcarthur Rossetti, MD       Or  . ondansetron [facility]) injection 4 mg  4 mg Intravenous Q6H PRN Mcarthur Rossetti, MD   4 mg at 04/21/16 0755  . oxyCODONE (Oxy IR/ROXICODONE) immediate release tablet 5-10 mg  5-10 mg Oral Q3H PRN Mcarthur Rossetti, MD   5 mg at 04/21/16 1901  . vitamin C (ASCORBIC ACID) tablet 250 mg  250 mg Oral Daily Mcarthur Rossetti, MD   250 mg at 04/21/16 0947  . vitamin E capsule 400 Units  400 Units Oral Daily Mcarthur Rossetti, MD   400 Units at 04/21/16 [id number]     Discharge Medications: Please see discharge summary for a list of discharge medications.  Relevant Imaging Results:  Relevant Lab Results:   Additional Information    [name], [place]

## 2016-04-21 NOTE — Progress Notes (Signed)
Physical Therapy Treatment Patient Details Name: Jacquelinne Brumlow MRN: [id number] DOB: 1956-04-21 Today's Date: 04/21/2016    History of Present Illness This 60 y.o. female admitted for Rt THA direct anterior approach due to hip fracture secondary to fall.  PMH DM    PT Comments    Pt progressing; continue to recommend SNF  Follow Up Recommendations  Supervision/Assistance - 24 hour;SNF     Equipment Recommendations  None recommended by PT    Recommendations for Other Services       Precautions / Restrictions Precautions Precautions: Fall Restrictions Weight Bearing Restrictions: No RLE Weight Bearing: Weight bearing as tolerated    Mobility  Bed Mobility Overal bed mobility: Needs Assistance Bed Mobility: Sit to Supine     Supine to sit: Min assist;HOB elevated Sit to supine: HOB elevated;Min assist   General bed mobility comments: assist with RLE, multi-modal cues for safety and technique  Transfers Overall transfer level: Needs assistance Equipment used: Rolling walker (2 wheeled) Transfers: Sit to/from Stand Sit to Stand: Min assist         General transfer comment: min cues for hand placement and assist  to steady   Ambulation/Gait Ambulation/Gait assistance: Min assist Ambulation Distance (Feet): 14 Feet Assistive device: Rolling walker (2 wheeled) Gait Pattern/deviations: Step-to pattern     General Gait Details: assist to steady, cues for sequence and WBing RLE, LOB x1 with min/mod assist to recover   Stairs            Wheelchair Mobility    Modified Rankin (Stroke Patients Only)       Balance           Standing balance support: Bilateral upper extremity supported Standing balance-Leahy Scale: Poor                      Cognition Arousal/Alertness: Awake/alert Behavior During Therapy: WFL for tasks assessed/performed;Anxious Overall Cognitive Status: No family/caregiver present to determine baseline cognitive  functioning Area of Impairment: Memory;Following commands;Problem solving     Memory: Decreased short-term memory Following Commands: Follows one step commands consistently;Follows one step commands with increased time;Follows multi-step commands inconsistently Safety/Judgement: Decreased awareness of safety   Problem Solving: Slow processing;Difficulty sequencing;Requires verbal cues;Requires tactile cues      Exercises Total Joint Exercises Ankle Circles/Pumps: AROM;10 reps;Both Quad Sets: AROM;Both;10 reps Heel Slides: AROM;Right;10 reps Hip ABduction/ADduction: AROM;Right;10 reps    General Comments        Pertinent Vitals/Pain Faces Pain Scale: Hurts even more Pain Location: right hip Pain Descriptors / Indicators: Aching;Grimacing;Guarding Pain Intervention(s): Limited activity within patient's tolerance;Monitored during session;Ice applied    Home Living                      Prior Function            PT Goals (current goals can now be found in the care plan section) Acute Rehab PT Goals Patient Stated Goal: to not fall  PT Goal Formulation: With patient Time For Goal Achievement: 04/28/16 Potential to Achieve Goals: Good Progress towards PT goals: Progressing toward goals    Frequency    7X/week      PT Plan Current plan remains appropriate    Co-evaluation             End of Session Equipment Utilized During Treatment: Gait belt Activity Tolerance: Patient limited by fatigue Patient left: in bed;with call bell/phone within reach;with bed alarm set     Time:  [id number] PT Time Calculation (min) (ACUTE ONLY): 21 min  Charges:  $Gait Training: 8-22 mins                    G Codes:      Kenna Kirn May 14, 2016, 4:49 PM

## 2016-04-21 NOTE — Care Management Note (Addendum)
Case Management Note  Patient Details  Name: Janetta Egli MRN: [id number] Date of Birth: 05-04-1956  Subjective/Objective:   Right THA                 Action/Plan: Discharge Planning: NCM spoke to pt and scheduled to go to SNF-rehab. Has RW at home.  CSW following for SNF.    Expected Discharge Date:  04/21/2016              Expected Discharge Plan:  [place]  In-House Referral:  Clinical Social Work  Discharge planning Services  CM Consult  Post Acute Care Choice:  NA Choice offered to:  NA  DME Arranged:  N/A DME Agency:  NA  HH Arranged:  NA HH Agency:  NA  Status of Service:  Completed, signed off  If discussed at [employer] of Stay Meetings, dates discussed:    Additional Comments:  Erenest Rasher, RN 04/21/2016, 9:46 AM

## 2016-04-21 NOTE — Op Note (Signed)
Danielle Sullivan, Danielle Sullivan                 ACCOUNT NO.:  [id number]  MEDICAL RECORD NO.:  [id number]  LOCATION:  70                         FACILITY:  [facility]  PHYSICIAN:  Lind Guest. 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  PROCEDURES: 1. Removal of right hip rod and hip screw from right hip. 2. Right total hip arthroplasty through direct anterior approach.  IMPLANTS:  [employer] acetabular component size 48, size 32+ 0 polyethylene liner, size 10 [place] femoral component with standard offset, size 32+ 9 ceramic hip ball.  SURGEON:  Lind Guest. Ninfa Linden, M.D.  ASSISTANT:  Erskine Emery, PA-C.  ANESTHESIA:  General.  ANTIBIOTICS:  2 g of IV Ancef.  BLOOD LOSS:  350 mL.  COMPLICATIONS:  None.  INDICATIONS:  Ms. Riskin is a 60 year old diabetic female, who in 2012 sustained a right hip femoral neck fracture.  This was treated in [place], [place] with short intramedullary nail and hip screw. She said that she always walks with a limp after that and never got out of pain completely.  She has since relocated down in [place] and had a mechanical fall a few weeks ago.  After then, she could even walk even more and x-rays were obtained that showed a chronic nonunion of right femoral neck and failure of the hardware, the hip screw had completely broken.  At this point, I recommend removal of hardware if possible and conversion to the total hip replacement.  The risks and benefits of the surgery were explained to her in detail and she did wish to proceed with surgery.  PROCEDURE DESCRIPTION:  After informed consent was obtained, appropriate right hip  was marked.  She was brought to the operating room where general anesthesia was obtained while she was on a stretcher.  A Foley catheter was placed and traction boots were placed on both of her feet. Next, she was placed supine on the Hana fracture table with the perineal post in place and both legs in inline skeletal traction devices, but no traction applied.  The right hip was prepped and draped with DuraPrep and sterile drapes.  Time-out was called and she was identified as correct patient and correct right hip.  We then made a wide incision over a previous incision from her hip screw placement, dissected down to the hip screw.  We were able to remove the hip screw, which was a Synthes screw and the rod in its entirety except for the broken out part of the femoral head.  Once we were able to do this, we irrigated the soft tissue with normal saline solution.  We then took a different incision and different approach for anterior hip replacement.  We made an incision over the tensor fascia through the skin and dissected down to the tensor fascia.  This was then divided longitudinally, so we could proceed with a direct anterior approach to  the hip.  We identified and cauterized the circumflex vessels and opened up the hip capsule, finding significant effusion from her nonunion femoral neck.  We were able to easily make a new refraction femoral neck cut, even though the bone was significantly comminuted.  We removed the femoral head in its entirety. We then cleaned the acetabulum and debris including remnants of the acetabular labrum and placed a bent Hohmann over the medial acetabular rim and then began reaming from a size 42 reamer up to the size 48 in stepwise increments.  This was all done under direct visualization with the last under direct fluoroscopy.  We then placed a real acetabular component under direct fluoroscopy and visualization, which was a size 48.  We placed a 32+ 0 liner  for that acetabular component.  Attention was then turned to the femur where the leg externally rotated to 120 degrees, extended and adducted.  It took [place] a while to find the femoral canal due to the overgrowth of bone in this area.  Once we were able to open up the femoral canal, we then began broaching from size 8 broach up to a size 10.  With the size 10 in place, we trialed a standard offset femoral neck and the 36+ 1.5 hip ball, we reduced this in the acetabulum.  We felt like we need to get much more leg length and offset.  We then dislocated the hip and removed all components.  We were able to place the real size 10 femoral component with standard offset and the real 32+ 9 ceramic hip ball.  We reduced this in the acetabulum and we were pleased with stability and range of motion.  We then irrigated both wounds with normal saline solution and both wounds of the deep tissue repaired by #1 Vicryl followed by 0 Vicryl, 2-0 Vicryl and staples on both incisions.  Xeroform and Aquacel dressing were placed on both sides.  She was taken off the Hana table, awakened, extubated and taken to the recovery room in stable condition.  All final counts were correct.  There were no complications noted.  Of note, Erskine Emery, PA- C was assisted in the entire case.  His assistance was crucial for facilitating all aspects of this case.     Lind Guest. Ninfa Linden, M.D.     CYB/MEDQ  D:  04/20/2016  T:  04/21/2016  Job:  [id number]

## 2016-04-21 NOTE — Progress Notes (Signed)
Subjective: 1 Day Post-Op Procedure(s) (LRB): RIGHT TOTAL HIP ARTHROPLASTY ANTERIOR APPROACH (Right) HARDWARE REMOVAL (Right) Patient reports pain as moderate.    Objective: Vital signs in last 24 hours: Temp:  [97.6 F (36.4 C)-98.6 F (37 C)] 98.1 F (36.7 C) (11/04 0445) Pulse Rate:  [89-116] 89 (11/04 0445) Resp:  [12-23] 12 (11/04 0445) BP: (106-152)/(49-78) 124/65 (11/04 0445) SpO2:  [94 %-100 %] 100 % (11/04 0445) Weight:  [183 lb 1 oz (83 kg)] 183 lb 1 oz (83 kg) (11/03 1024)  Intake/Output from previous day: 11/03 0701 - 11/04 0700 In: 3765 [P.O.:960; I.V.:2400; IV Piggyback:405] Out: 1300 [Urine:1000; Blood:300] Intake/Output this shift: No intake/output data recorded.   Recent Labs  04/21/16 0447  HGB 11.3*    Recent Labs  04/21/16 0447  WBC 12.2*  RBC 3.79*  HCT 35.6*  PLT 409*    Recent Labs  04/21/16 0447  NA 131*  K 4.5  CL 98*  CO2 26  BUN 16  CREATININE 0.73  GLUCOSE 186*  CALCIUM 8.3*   No results for input(s): LABPT, INR in the last 72 hours.  Sensation intact distally Intact pulses distally Dorsiflexion/Plantar flexion intact Incision: scant drainage Compartment soft  Assessment/Plan: 1 Day Post-Op Procedure(s) (LRB): RIGHT TOTAL HIP ARTHROPLASTY ANTERIOR APPROACH (Right) HARDWARE REMOVAL (Right) Up with therapy Discharge to SNF likely Monday  Mcarthur Rossetti 04/21/2016, 7:23 AM

## 2016-04-21 NOTE — NC FL2 (Signed)
[place] LEVEL OF CARE SCREENING TOOL     IDENTIFICATION  Patient Name: Danielle Sullivan Birthdate: [birth year] Sex: female Admission Date (Current Location): 04/20/2016  [place] and [place] Number:  Kathleen Argue [id number] [place] and Address:  The . [facility], [place] [address], [place], [place] [address]      Provider Number: [id number]  Attending Physician Name and Address:  Mcarthur Rossetti, *  Relative Name and Phone Number:       Current Level of Care: Hospital Recommended Level of Care: [place] Prior Approval Number:    Date Approved/Denied:   PASRR Number:    Discharge Plan: SNF    Current Diagnoses: Patient Active Problem List   Diagnosis Date Noted  . Status post total replacement of right hip 04/20/2016  . 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 Hip fracture ([place]) 03/29/2016  . Diabetes mellitus type 2 in obese ([place]) 03/29/2016  . Closed fracture of right hip (HCC)     Orientation RESPIRATION BLADDER Height & Weight     Self, Time, Situation  Normal Incontinent Weight: 183 lb 1 oz (83 kg) Height:  5\' 1"  (154.9 cm)  BEHAVIORAL SYMPTOMS/MOOD NEUROLOGICAL BOWEL NUTRITION STATUS      Incontinent Diet (Carb modified)  AMBULATORY STATUS COMMUNICATION OF NEEDS Skin   Extensive Assist Verbally Surgical wounds (R hip incision)                       Personal Care Assistance Level of Assistance  Bathing, Dressing Bathing Assistance: Limited assistance Feeding assistance: Independent Dressing Assistance: Limited assistance     Functional Limitations Info    Sight Info: Adequate Hearing Info: Adequate Speech Info: Adequate    SPECIAL CARE FACTORS FREQUENCY  PT (By licensed PT), OT (By licensed OT)  Sliding Scale insulin     PT Frequency: 5 OT Frequency: 5            Contractures Contractures Info: Not present     Additional Factors Info    Code Status Info: Full code Allergies Info: [place]           Current Medications (04/21/2016):  This is the current hospital active medication list Current Facility-Administered Medications  Medication Dose Route Frequency Provider Last Rate Last Dose  . 0.9 %  sodium chloride infusion   Intravenous Continuous Mcarthur Rossetti, MD 10 mL/hr at 04/21/16 1853    . acetaminophen (TYLENOL) tablet 650 mg  650 mg Oral Q6H PRN Mcarthur Rossetti, MD       Or  . acetaminophen (TYLENOL) suppository 650 mg  650 mg Rectal Q6H PRN Mcarthur Rossetti, MD      . alum & mag hydroxide-simeth (MAALOX/MYLANTA) 200-200-20 MG/5ML suspension 30 mL  30 mL Oral Q4H PRN Mcarthur Rossetti, MD      . aspirin chewable tablet 81 mg  81 mg Oral BID Mcarthur Rossetti, MD   81 mg at 04/21/16 0946  . diphenhydrAMINE (BENADRYL) 12.5 MG/5ML elixir 12.5-25 mg  12.5-25 mg Oral Q4H PRN Mcarthur Rossetti, MD      . docusate sodium (COLACE) capsule 100 mg  100 mg Oral BID Mcarthur Rossetti, MD   100 mg at 04/21/16 0946  . feeding supplement (GLUCERNA SHAKE) (GLUCERNA SHAKE) liquid 237 mL  237 mL Oral TID BM Mcarthur Rossetti, MD   237 mL at 04/21/16 1306  .  HYDROmorphone (DILAUDID) injection 1 mg  1 mg Intravenous Q2H PRN Mcarthur Rossetti, MD   1 mg at 04/20/16 1758  . insulin aspart (novoLOG) injection 0-15 Units  0-15 Units Subcutaneous TID WC Mcarthur Rossetti, MD   3 Units at 04/21/16 1900  . insulin aspart (novoLOG) injection 4 Units  4 Units Subcutaneous TID WC Mcarthur Rossetti, MD   4 Units at 04/21/16 1901  . insulin glargine (LANTUS) injection 10 Units  10 Units Subcutaneous QHS Mcarthur Rossetti, MD   10 Units at 04/20/16 2149  . magnesium oxide (MAG-OX) tablet 400 mg  400 mg Oral Daily Mcarthur Rossetti, MD   400 mg at 04/21/16 0946  . menthol-cetylpyridinium (CEPACOL) lozenge 3 mg  1 lozenge Oral PRN Mcarthur Rossetti,  MD       Or  . phenol (CHLORASEPTIC) mouth spray 1 spray  1 spray Mouth/Throat PRN Mcarthur Rossetti, MD      . methocarbamol (ROBAXIN) tablet 500 mg  500 mg Oral Q6H PRN Mcarthur Rossetti, MD   500 mg at 04/21/16 1901   Or  . methocarbamol (ROBAXIN) 500 mg in dextrose 5 % 50 mL IVPB  500 mg Intravenous Q6H PRN Mcarthur Rossetti, MD   500 mg at 04/20/16 1630  . metoCLOPramide (REGLAN) tablet 5-10 mg  5-10 mg Oral Q8H PRN Mcarthur Rossetti, MD       Or  . metoCLOPramide (REGLAN) injection 5-10 mg  5-10 mg Intravenous Q8H PRN Mcarthur Rossetti, MD      . multivitamin (PROSIGHT) tablet 1 tablet  1 tablet Oral Daily Mcarthur Rossetti, MD   1 tablet at 04/21/16 0946  . nicotine (NICODERM CQ - dosed in mg/24 hours) patch 14 mg  14 mg Transdermal Daily Mcarthur Rossetti, MD   14 mg at 04/21/16 0951  . ondansetron (ZOFRAN) tablet 4 mg  4 mg Oral Q6H PRN Mcarthur Rossetti, MD       Or  . ondansetron [facility]) injection 4 mg  4 mg Intravenous Q6H PRN Mcarthur Rossetti, MD   4 mg at 04/21/16 0755  . oxyCODONE (Oxy IR/ROXICODONE) immediate release tablet 5-10 mg  5-10 mg Oral Q3H PRN Mcarthur Rossetti, MD   5 mg at 04/21/16 1901  . vitamin C (ASCORBIC ACID) tablet 250 mg  250 mg Oral Daily Mcarthur Rossetti, MD   250 mg at 04/21/16 0947  . vitamin E capsule 400 Units  400 Units Oral Daily Mcarthur Rossetti, MD   400 Units at 04/21/16 [id number]     Discharge Medications: Please see discharge summary for a list of discharge medications.  Relevant Imaging Results:  Relevant Lab Results:   Additional Information SSN:  [name], LCSW

## 2016-04-21 NOTE — NC FL2 (Signed)
[place] LEVEL OF CARE SCREENING TOOL     IDENTIFICATION  Patient Name: Danielle Sullivan Birthdate: [birth year] Sex: female Admission Date (Current Location): 03/28/2016  [facility] and [place] Number:  Herbalist and Address:  The [place]. [facility], [place] [address], [place], [place] [address]      Provider Number: [id number]  Attending Physician Name and Address:  No att. providers found  Relative Name and Phone Number:       Current Level of Care: Hospital Recommended Level of Care: [place] Prior Approval Number:    Date Approved/Denied:   PASRR Number:    Discharge Plan: SNF    Current Diagnoses: Patient Active Problem List   Diagnosis Date Noted  . Status post total replacement of right hip 04/20/2016  . 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 Hip fracture ([place]) 03/29/2016  . Diabetes mellitus type 2 in obese ([place]) 03/29/2016  . Closed fracture of right hip ([place])     Orientation RESPIRATION BLADDER Height & Weight     Self, Time, Situation, Place  Normal Continent Weight: 170 lb (77.1 kg) Height:  5\' 1"  (154.9 cm)  BEHAVIORAL SYMPTOMS/MOOD NEUROLOGICAL BOWEL NUTRITION STATUS   (None)  (none) Continent  (carb modified)  AMBULATORY STATUS COMMUNICATION OF NEEDS Skin   Extensive Assist Verbally Normal                       Personal Care Assistance Level of Assistance  Bathing, Feeding, Dressing Bathing Assistance: Limited assistance Feeding assistance: Independent Dressing Assistance: Limited assistance     Functional Limitations Info  Sight, Hearing, Speech Sight Info: Adequate Hearing Info: Adequate Speech Info: Adequate    SPECIAL CARE FACTORS FREQUENCY  PT (By licensed PT), OT (By licensed OT)     PT Frequency:  (5x/week) OT Frequency:  (5x/week)            Contractures Contractures Info: Not present    Additional  Factors Info  Code Status, Allergies Code Status Info:  (Full) Allergies Info:  [place])           Current Medications (04/21/2016):  This is the current hospital active medication list No current facility-administered medications for this encounter.    No current outpatient prescriptions on file.   Facility-Administered Medications Ordered in Other Encounters  Medication Dose Route Frequency Provider Last Rate Last Dose  . 0.9 %  sodium chloride infusion   Intravenous Continuous Mcarthur Rossetti, MD 10 mL/hr at 04/21/16 1853    . acetaminophen (TYLENOL) tablet 650 mg  650 mg Oral Q6H PRN Mcarthur Rossetti, MD       Or  . acetaminophen (TYLENOL) suppository 650 mg  650 mg Rectal Q6H PRN Mcarthur Rossetti, MD      . alum & mag hydroxide-simeth (MAALOX/MYLANTA) 200-200-20 MG/5ML suspension 30 mL  30 mL Oral Q4H PRN Mcarthur Rossetti, MD      . aspirin chewable tablet 81 mg  81 mg Oral BID Mcarthur Rossetti, MD   81 mg at 04/21/16 0946  . diphenhydrAMINE (BENADRYL) 12.5 MG/5ML elixir 12.5-25 mg  12.5-25 mg Oral Q4H PRN Mcarthur Rossetti, MD      . docusate sodium (COLACE) capsule 100 mg  100 mg Oral BID Mcarthur Rossetti, MD   100 mg at 04/21/16 0946  . feeding supplement (GLUCERNA SHAKE) (GLUCERNA SHAKE) liquid 237  mL  237 mL Oral TID BM Mcarthur Rossetti, MD   237 mL at 04/21/16 1306  . HYDROmorphone (DILAUDID) injection 1 mg  1 mg Intravenous Q2H PRN Mcarthur Rossetti, MD   1 mg at 04/20/16 1758  . insulin aspart (novoLOG) injection 0-15 Units  0-15 Units Subcutaneous TID WC Mcarthur Rossetti, MD   3 Units at 04/21/16 1900  . insulin aspart (novoLOG) injection 4 Units  4 Units Subcutaneous TID WC Mcarthur Rossetti, MD   4 Units at 04/21/16 1901  . insulin glargine (LANTUS) injection 10 Units  10 Units Subcutaneous QHS Mcarthur Rossetti, MD   10 Units at 04/20/16 2149  . magnesium oxide (MAG-OX) tablet 400 mg  400 mg Oral Daily  Mcarthur Rossetti, MD   400 mg at 04/21/16 0946  . menthol-cetylpyridinium (CEPACOL) lozenge 3 mg  1 lozenge Oral PRN Mcarthur Rossetti, MD       Or  . phenol (CHLORASEPTIC) mouth spray 1 spray  1 spray Mouth/Throat PRN Mcarthur Rossetti, MD      . methocarbamol (ROBAXIN) tablet 500 mg  500 mg Oral Q6H PRN Mcarthur Rossetti, MD   500 mg at 04/21/16 1901   Or  . methocarbamol (ROBAXIN) 500 mg in dextrose 5 % 50 mL IVPB  500 mg Intravenous Q6H PRN Mcarthur Rossetti, MD   500 mg at 04/20/16 1630  . metoCLOPramide (REGLAN) tablet 5-10 mg  5-10 mg Oral Q8H PRN Mcarthur Rossetti, MD       Or  . metoCLOPramide (REGLAN) injection 5-10 mg  5-10 mg Intravenous Q8H PRN Mcarthur Rossetti, MD      . multivitamin (PROSIGHT) tablet 1 tablet  1 tablet Oral Daily Mcarthur Rossetti, MD   1 tablet at 04/21/16 0946  . nicotine (NICODERM CQ - dosed in mg/24 hours) patch 14 mg  14 mg Transdermal Daily Mcarthur Rossetti, MD   14 mg at 04/21/16 0951  . ondansetron (ZOFRAN) tablet 4 mg  4 mg Oral Q6H PRN Mcarthur Rossetti, MD       Or  . ondansetron [facility]) injection 4 mg  4 mg Intravenous Q6H PRN Mcarthur Rossetti, MD   4 mg at 04/21/16 0755  . oxyCODONE (Oxy IR/ROXICODONE) immediate release tablet 5-10 mg  5-10 mg Oral Q3H PRN Mcarthur Rossetti, MD   5 mg at 04/21/16 1901  . vitamin C (ASCORBIC ACID) tablet 250 mg  250 mg Oral Daily Mcarthur Rossetti, MD   250 mg at 04/21/16 0947  . vitamin E capsule 400 Units  400 Units Oral Daily Mcarthur Rossetti, MD   400 Units at 04/21/16 [id number]     Discharge Medications: Please see discharge summary for a list of discharge medications.  Relevant Imaging Results:  Relevant Lab Results:   Additional Information    [name], [place]

## 2016-04-21 NOTE — Evaluation (Signed)
Physical Therapy Evaluation Patient Details Name: Danielle Sullivan MRN: [id number] DOB: [birth year] Today's Date: 04/21/2016   History of Present Illness  This 60 y.o. female admitted for Rt THA direct anterior approach due to hip fracture secondary to fall.  PMH DM  Clinical Impression  Pt admitted with above diagnosis. Pt currently with functional limitations due to the deficits listed below (see PT Problem List).  Pt will benefit from skilled PT to increase their independence and safety with mobility to allow discharge to the venue listed below.  Agree with plan for return to SNF to allow pt to maximize independence;     Follow Up Recommendations Supervision/Assistance - 24 hour;SNF    Equipment Recommendations  None recommended by PT    Recommendations for Other Services       Precautions / Restrictions Precautions Precautions: Fall Restrictions Weight Bearing Restrictions: No RLE Weight Bearing: Weight bearing as tolerated      Mobility  Bed Mobility Overal bed mobility: Needs Assistance Bed Mobility: Supine to Sit     Supine to sit: Min assist;HOB elevated     General bed mobility comments: assist with RLE, multi-modal cues for safety and technique  Transfers Overall transfer level: Needs assistance Equipment used: Rolling walker (2 wheeled) Transfers: Sit to/from [facility] Sit to Stand: Min assist Stand pivot transfers: Min assist       General transfer comment: min cues for hand placement and assist  to steady   Ambulation/Gait Ambulation/Gait assistance: Min assist Ambulation Distance (Feet): 4 Feet Assistive device: Rolling walker (2 wheeled) Gait Pattern/deviations: Step-to pattern     General Gait Details: assist to steady, cues for sequence and WBing RLE;   Stairs            Wheelchair Mobility    Modified Rankin (Stroke Patients Only)       Balance Overall balance assessment: History of Falls;Needs  assistance Sitting-balance support: Feet supported Sitting balance-Leahy Scale: Fair     Standing balance support: Bilateral upper extremity supported;During functional activity Standing balance-Leahy Scale: Poor Standing balance comment: reliant on UEs for balance; long hx of falls                             Pertinent Vitals/Pain Pain Assessment: Faces Faces Pain Scale: Hurts even more Pain Location: right hip Pain Descriptors / Indicators: Aching;Operative site guarding Pain Intervention(s): Limited activity within patient's tolerance;Monitored during session;Ice applied;Repositioned    Home Living Family/patient expects to be discharged to:: Skilled nursing facility Living Arrangements: Parent;Other relatives Available Help at Discharge: Family;Available PRN/intermittently Type of Home: House Home Access: Stairs to enter Entrance Stairs-Rails: Right Entrance Stairs-Number of Steps: 4 Home Layout: One level Home Equipment: Walker - 2 wheels;Cane - quad;Shower seat      Prior Function Level of Independence: Independent with assistive device(s)         Comments: Pt reports she was independent with ADL and functional mobility prior to fall.  She indicates a long h/o falls.  She has been at SNF since her fall, and has been working with PT and required assist for ADLs      Hand Dominance   Dominant Hand: Right    Extremity/Trunk Assessment   Upper Extremity Assessment: Generalized weakness;Defer to OT evaluation           Lower Extremity Assessment: RLE deficits/detail RLE Deficits / Details: limited by pain; generalized weakness throughout     Cervical / Trunk  Assessment: Normal  Communication   Communication: No difficulties  Cognition Arousal/Alertness: Awake/alert Behavior During Therapy: WFL for tasks assessed/performed;Anxious Overall Cognitive Status: No family/caregiver present to determine baseline cognitive functioning Area of  Impairment: Memory;Following commands;Problem solving     Memory: Decreased short-term memory Following Commands: Follows one step commands consistently;Follows one step commands with increased time;Follows multi-step commands inconsistently Safety/Judgement: Decreased awareness of safety   Problem Solving: Slow processing;Difficulty sequencing;Requires verbal cues;Requires tactile cues      General Comments      Exercises Total Joint Exercises Ankle Circles/Pumps: AROM;10 reps;Both Quad Sets: 5 reps;AROM;Both   Assessment/Plan    PT Assessment Patient needs continued PT services  PT Problem List Decreased strength;Decreased range of motion;Decreased activity tolerance;Decreased balance;Decreased mobility;Decreased coordination;Decreased knowledge of use of DME;Pain          PT Treatment Interventions DME instruction;Gait training;Stair training;Functional mobility training;Therapeutic activities;Therapeutic exercise;Balance training;Neuromuscular re-education;Patient/family education    PT Goals (Current goals can be found in the Care Plan section)  Acute Rehab PT Goals Patient Stated Goal: to not fall  PT Goal Formulation: With patient Time For Goal Achievement: 04/28/16 Potential to Achieve Goals: Good    Frequency Min 5X/week   Barriers to discharge        Co-evaluation               End of Session Equipment Utilized During Treatment: Gait belt Activity Tolerance: Patient limited by fatigue Patient left: in chair;with call bell/phone within reach;with chair alarm set Nurse Communication: Mobility status         Time: [id number] PT Time Calculation (min) (ACUTE ONLY): 18 min   Charges:   PT Evaluation $PT Eval Low Complexity: 1 Procedure     PT G Codes:        Tito Ausmus 2016-04-30, 12:34 PM

## 2016-04-21 NOTE — Evaluation (Signed)
Occupational Therapy Evaluation Patient Details Name: Danielle Sullivan MRN: [id number] DOB: 1955/09/21 Today's Date: 04/21/2016    History of Present Illness This 60 y.o. female admitted for Rt THA direct anterior approach due to hip fracture secondary to fall.  PMH DM   Clinical Impression   Pt admitted with above. She demonstrates the below listed deficits and will benefit from continued OT to maximize safety and independence with BADLs. Pt requires mod A overall with LB ADLs, and min A for functional transfers.  She has little support at discharge.  Recommend SNF      Follow Up Recommendations  SNF;Supervision/Assistance - 24 hour    Equipment Recommendations  None recommended by OT    Recommendations for Other Services       Precautions / Restrictions Precautions Precautions: Fall Restrictions Weight Bearing Restrictions: No RLE Weight Bearing: Weight bearing as tolerated      Mobility Bed Mobility                  Transfers Overall transfer level: Needs assistance Equipment used: Rolling walker (2 wheeled) Transfers: Sit to/from Stand Sit to Stand: Min assist         General transfer comment: min cues for hand placement and to steady     Balance Overall balance assessment: Needs assistance Sitting-balance support: Feet supported Sitting balance-Leahy Scale: Fair     Standing balance support: Bilateral upper extremity supported Standing balance-Leahy Scale: Poor                              ADL Overall ADL's : Needs assistance/impaired Eating/Feeding: Independent   Grooming: Wash/dry hands;Wash/dry face;Oral care;Brushing hair;Set up;Sitting   Upper Body Bathing: Sitting   Lower Body Bathing: Moderate assistance;Sit to/from stand   Upper Body Dressing : Set up;Sitting   Lower Body Dressing: Total assistance;Sit to/from stand   Toilet Transfer: Minimal assistance;Stand-pivot;BSC;RW   Toileting- Clothing Manipulation and  Hygiene: Maximal assistance;Sit to/from stand       Functional mobility during ADLs: Minimal assistance;Rolling walker General ADL Comments: Pt requires cues for safety and sequencing      Vision     Perception     Praxis      Pertinent Vitals/Pain Pain Assessment: Faces Faces Pain Scale: Hurts little more Pain Location: Rt hip  Pain Descriptors / Indicators: Aching;Operative site guarding Pain Intervention(s): Monitored during session;Repositioned;Ice applied     Hand Dominance Right   Extremity/Trunk Assessment Upper Extremity Assessment Upper Extremity Assessment: Generalized weakness   Lower Extremity Assessment Lower Extremity Assessment: Defer to PT evaluation   Cervical / Trunk Assessment Cervical / Trunk Assessment: Normal   Communication Communication Communication: No difficulties   Cognition Arousal/Alertness: Awake/alert Behavior During Therapy: WFL for tasks assessed/performed;Anxious Overall Cognitive Status: No family/caregiver present to determine baseline cognitive functioning Area of Impairment: Memory;Following commands;Problem solving     Memory: Decreased short-term memory Following Commands: Follows one step commands consistently;Follows one step commands with increased time;Follows multi-step commands inconsistently Safety/Judgement: Decreased awareness of safety   Problem Solving: Slow processing;Difficulty sequencing;Requires verbal cues;Requires tactile cues     General Comments       Exercises       Shoulder Instructions      Home Living Family/patient expects to be discharged to:: Skilled nursing facility Living Arrangements: Parent;Other relatives Available Help at Discharge: Family;Available PRN/intermittently Type of Home: House Home Access: Stairs to enter Entrance Stairs-Number of Steps: 4 Entrance Stairs-Rails: Right  Home Layout: One level     Bathroom Shower/Tub: Tub/shower unit Shower/tub characteristics:  Architectural technologist: Standard     Home Equipment: Environmental consultant - 2 wheels;Cane - quad;Shower seat          Prior Functioning/Environment Level of Independence: Independent with assistive device(s)        Comments: Pt reports she was independent with ADL and functional mobility prior to fall.  She indicates a long h/o falls.  She has been at SNF since her fall, and has been working with PT and required assist for ADLs         OT Problem List: Decreased strength;Decreased activity tolerance;Impaired balance (sitting and/or standing);Decreased safety awareness;Decreased knowledge of use of DME or AE;Pain;Decreased knowledge of precautions;Decreased cognition;Obesity   OT Treatment/Interventions: Self-care/ADL training;Therapeutic exercise;Energy conservation;DME and/or AE instruction;Therapeutic activities;Cognitive remediation/compensation;Patient/family education;Balance training    OT Goals(Current goals can be found in the care plan section) Acute Rehab OT Goals Patient Stated Goal: to not fall  OT Goal Formulation: With patient Time For Goal Achievement: 05/05/16 Potential to Achieve Goals: Good ADL Goals Pt Will Perform Lower Body Bathing: with min assist;sit to/from stand;with adaptive equipment Pt Will Perform Lower Body Dressing: with min assist;sitting/lateral leans Pt Will Transfer to Toilet: with min guard assist;ambulating;regular height toilet;bedside commode;grab bars Pt Will Perform Toileting - Clothing Manipulation and hygiene: with min guard assist;sit to/from stand  OT Frequency: Min 2X/week   Barriers to D/C: Decreased caregiver support          Co-evaluation              End of Session Equipment Utilized During Treatment: Gait belt;Rolling walker Nurse Communication: Mobility status  Activity Tolerance: Patient tolerated treatment well Patient left: in chair;with call bell/phone within reach   Time: [id number] OT Time Calculation (min): 20  min Charges:  OT General Charges $OT Visit: 1 Procedure OT Evaluation $OT Eval Low Complexity: 1 Procedure G-Codes:    Briea Mcenery M 2016/05/16, 11:46 AM

## 2016-04-22 LAB — CBC
HCT: 32.3 % — ABNORMAL LOW (ref 36.0–46.0)
HEMOGLOBIN: 10.4 g/dL — AB (ref 12.0–15.0)
MCH: 29.7 pg (ref 26.0–34.0)
MCHC: 32.2 g/dL (ref 30.0–36.0)
MCV: 92.3 fL (ref 78.0–100.0)
PLATELETS: 369 10*3/uL (ref 150–400)
RBC: 3.5 MIL/uL — AB (ref 3.87–5.11)
RDW: 14.3 % (ref 11.5–15.5)
WBC: 10 10*3/uL (ref 4.0–10.5)

## 2016-04-22 LAB — GLUCOSE, CAPILLARY
GLUCOSE-CAPILLARY: 169 mg/dL — AB (ref 65–99)
GLUCOSE-CAPILLARY: 226 mg/dL — AB (ref 65–99)
Glucose-Capillary: 182 mg/dL — ABNORMAL HIGH (ref 65–99)
Glucose-Capillary: 155 mg/dL — ABNORMAL HIGH (ref 65–99)

## 2016-04-22 MED ORDER — ASPIRIN 81 MG PO CHEW: 81.0000 mg | CHEWABLE_TABLET | Freq: Two times a day (BID) | ORAL | 0 refills | Status: DC

## 2016-04-22 MED ORDER — OXYCODONE-ACETAMINOPHEN 5-325 MG PO TABS: 1.0000 | ORAL_TABLET | ORAL | 0 refills | Status: DC | PRN

## 2016-04-22 NOTE — Discharge Instructions (Signed)

## 2016-04-22 NOTE — Progress Notes (Signed)
Per note physician note, the plan for the pt is to be discharged back to her previous SNF which will be [facility].  Tilda Burrow, MSW

## 2016-04-22 NOTE — Progress Notes (Signed)
Subjective: 2 Days Post-Op Procedure(s) (LRB): RIGHT TOTAL HIP ARTHROPLASTY ANTERIOR APPROACH (Right) HARDWARE REMOVAL (Right) Patient reports pain as moderate.  Did get up with therapy yesterday.  Acute blood loss anemia from surgery, but asymptomatic.  Objective: Vital signs in last 24 hours: Temp:  [98.5 F (36.9 C)-99.2 F (37.3 C)] 99.2 F (37.3 C) (11/05 0526) Pulse Rate:  [96-103] 98 (11/05 0526) Resp:  [17-18] 18 (11/05 0526) BP: (109-130)/(48-67) 114/56 (11/05 0526) SpO2:  [96 %-97 %] 96 % (11/05 0526)  Intake/Output from previous day: 11/04 0701 - 11/05 0700 In: 2180 [P.O.:1280; I.V.:900] Out: 700 [Urine:700] Intake/Output this shift: Total I/O In: -  Out: 700 [Urine:700]   Recent Labs  04/21/16 0447 04/22/16 0457  HGB 11.3* 10.4*    Recent Labs  04/21/16 0447 04/22/16 0457  WBC 12.2* 10.0  RBC 3.79* 3.50*  HCT 35.6* 32.3*  PLT 409* 369    Recent Labs  04/21/16 0447  NA 131*  K 4.5  CL 98*  CO2 26  BUN 16  CREATININE 0.73  GLUCOSE 186*  CALCIUM 8.3*   No results for input(s): LABPT, INR in the last 72 hours.  Sensation intact distally Intact pulses distally Dorsiflexion/Plantar flexion intact Incision: scant drainage  Assessment/Plan: 2 Days Post-Op Procedure(s) (LRB): RIGHT TOTAL HIP ARTHROPLASTY ANTERIOR APPROACH (Right) HARDWARE REMOVAL (Right) Up with therapy Plan for discharge tomorrow Discharge to SNF  Mcarthur Rossetti 04/22/2016, 8:14 AM

## 2016-04-22 NOTE — Progress Notes (Signed)
Physical Therapy Treatment Patient Details Name: Danielle Sullivan MRN: [id number] DOB: 22-Feb-1956 Today's Date: 04/22/2016    History of Present Illness This 60 y.o. female admitted for Rt THA direct anterior approach due to hip fracture secondary to fall.  PMH DM    PT Comments    Pt will benefit from SNF post acute; pt is progressing slowly, with incr dizziness with standing today--BP checked--WNL; see below for details; will continue to follow  Follow Up Recommendations  Supervision/Assistance - 24 hour;SNF     Equipment Recommendations  None recommended by PT    Recommendations for Other Services       Precautions / Restrictions Precautions Precautions: Fall Restrictions RLE Weight Bearing: Weight bearing as tolerated    Mobility  Bed Mobility               General bed mobility comments: OOB with nursing staff--LOB and near fall with NT earlier this am  Transfers Overall transfer level: Needs assistance Equipment used: Rolling walker (2 wheeled) Transfers: Sit to/from Stand Sit to Stand: Min assist         General transfer comment: repeated x2; min cues for hand placement and assist  to steady   Ambulation/Gait             General Gait Details: unable d/t incr dizziness in standing; BP checked in sitting  143/63, HR 112;  BP in standing 151/61, HR 116   Stairs            Wheelchair Mobility    Modified Rankin (Stroke Patients Only)       Balance Overall balance assessment: Needs assistance         Standing balance support: Bilateral upper extremity supported Standing balance-Leahy Scale: Poor Standing balance comment: reliant on UEs for balance                    Cognition Arousal/Alertness: Awake/alert Behavior During Therapy: WFL for tasks assessed/performed;Anxious Overall Cognitive Status: No family/caregiver present to determine baseline cognitive functioning Area of Impairment: Memory;Following commands;Problem  solving     Memory: Decreased short-term memory Following Commands: Follows one step commands consistently;Follows one step commands with increased time;Follows multi-step commands inconsistently Safety/Judgement: Decreased awareness of safety   Problem Solving: Requires verbal cues;Requires tactile cues General Comments: pt is very fearful of falling    Exercises Total Joint Exercises Ankle Circles/Pumps: AROM;10 reps;Both Quad Sets: AROM;Both;10 reps Short Arc Quad: AROM;Right;10 reps Heel Slides: AROM;Right;10 reps;AAROM Hip ABduction/ADduction: AROM;Right;10 reps;AAROM (painful)    General Comments        Pertinent Vitals/Pain Pain Assessment: Faces Faces Pain Scale: Hurts little more Pain Location: right knee Pain Descriptors / Indicators: Sore;Operative site guarding Pain Intervention(s): Limited activity within patient's tolerance;Monitored during session;Premedicated before session;Repositioned    Home Living                      Prior Function            PT Goals (current goals can now be found in the care plan section) Acute Rehab PT Goals Patient Stated Goal: to not fall  PT Goal Formulation: With patient Time For Goal Achievement: 04/28/16 Potential to Achieve Goals: Good Progress towards PT goals: Not progressing toward goals - comment (dizziness')    Frequency    7X/week      PT Plan Current plan remains appropriate    Co-evaluation             End  of Session Equipment Utilized During Treatment: Gait belt Activity Tolerance: Patient limited by fatigue;Other (comment);Treatment limited secondary to medical complications (Comment) (incr dizziness with standing) Patient left: in chair;with call bell/phone within reach;with chair alarm set     Time: 1125-1149 PT Time Calculation (min) (ACUTE ONLY): 24 min  Charges:  $Therapeutic Exercise: 8-22 mins $Therapeutic Activity: 8-22 mins                    G Codes:       Demetress Tift 05-01-2016, 12:19 PM

## 2016-04-23 ENCOUNTER — Encounter (HOSPITAL_COMMUNITY): Payer: Self-pay | Admitting: Orthopaedic Surgery

## 2016-04-23 LAB — GLUCOSE, CAPILLARY
GLUCOSE-CAPILLARY: 169 mg/dL — AB (ref 65–99)
Glucose-Capillary: 173 mg/dL — ABNORMAL HIGH (ref 65–99)

## 2016-04-23 NOTE — Progress Notes (Signed)
Subjective: 3 Days Post-Op Procedure(s) (LRB): RIGHT TOTAL HIP ARTHROPLASTY ANTERIOR APPROACH (Right) HARDWARE REMOVAL (Right) Patient reports pain as moderate.    Objective: Vital signs in last 24 hours: Temp:  [98 F (36.7 C)-98.3 F (36.8 C)] 98 F (36.7 C) (11/06 0447) Pulse Rate:  [96-103] 100 (11/06 0447) Resp:  [17] 17 (11/06 0447) BP: (100-120)/(56-69) 106/56 (11/06 0447) SpO2:  [96 %-100 %] 96 % (11/06 0447)  Intake/Output from previous day: 11/05 0701 - 11/06 0700 In: 1160 [P.O.:1160] Out: 1600 [Urine:1600] Intake/Output this shift: No intake/output data recorded.   Recent Labs  04/21/16 0447 04/22/16 0457  HGB 11.3* 10.4*    Recent Labs  04/21/16 0447 04/22/16 0457  WBC 12.2* 10.0  RBC 3.79* 3.50*  HCT 35.6* 32.3*  PLT 409* 369    Recent Labs  04/21/16 0447  NA 131*  K 4.5  CL 98*  CO2 26  BUN 16  CREATININE 0.73  GLUCOSE 186*  CALCIUM 8.3*   No results for input(s): LABPT, INR in the last 72 hours.  Sensation intact distally Intact pulses distally Dorsiflexion/Plantar flexion intact Incision: dressing C/D/I  Assessment/Plan: 3 Days Post-Op Procedure(s) (LRB): RIGHT TOTAL HIP ARTHROPLASTY ANTERIOR APPROACH (Right) HARDWARE REMOVAL (Right) Up with therapy  Discharge to skilled nursing today.  Mcarthur Rossetti 04/23/2016, 7:01 AM

## 2016-04-23 NOTE — Anesthesia Postprocedure Evaluation (Signed)
Anesthesia Post Note  Patient: Danielle Sullivan  Procedure(s) Performed: Procedure(s) (LRB): RIGHT TOTAL HIP ARTHROPLASTY ANTERIOR APPROACH (Right) HARDWARE REMOVAL (Right)  Anesthesia Post Evaluation   Last Vitals:  Vitals:   04/22/16 2134 04/23/16 0447  BP: 100/69 (!) 106/56  Pulse: (!) 103 100  Resp: 17 17  Temp: 36.7 C 36.7 C    Last Pain:  Vitals:   04/23/16 1256  TempSrc:   PainSc: 3    Pain Goal: Patients Stated Pain Goal: 2 (04/23/16 1256)               [place]

## 2016-04-23 NOTE — Discharge Summary (Signed)
Patient ID: Danielle Sullivan MRN: [id number] DOB/AGE: [birth year] 60 y.o.  Admit date: 04/20/2016 Discharge date: 04/23/2016  Admission Diagnoses:  Principal Problem:   Closed fracture of right hip [facility]) Active Problems:   Displaced fracture of base of neck of right femur, subsequent encounter for closed fracture with nonunion   Status post total replacement of right hip   Discharge Diagnoses:  Same  Past Medical History:  Diagnosis Date  . Diabetes mellitus without complication ([place])    type II  . Displaced fracture of base of neck of right femur, initial encounter for closed fracture ([place])   . Hx of fall   . Muscle weakness (generalized)   . Nicotine dependence   . Obesity     Surgeries: Procedure(s): RIGHT TOTAL HIP ARTHROPLASTY ANTERIOR APPROACH HARDWARE REMOVAL on 04/20/2016   Consultants:   Discharged Condition: Improved  Hospital Course: Kindall Kornhauser is an 60 y.o. female who was admitted 04/20/2016 for operative treatment ofClosed fracture of right hip ([place]). Patient has severe unremitting pain that affects sleep, daily activities, and work/hobbies. After pre-op clearance the patient was taken to the operating room on 04/20/2016 and underwent  Procedure(s): RIGHT TOTAL HIP ARTHROPLASTY ANTERIOR APPROACH HARDWARE REMOVAL.    Patient was given perioperative antibiotics: Anti-infectives    Start     Dose/Rate Route Frequency Ordered Stop   04/20/16 1830  ceFAZolin (ANCEF) IVPB 1 g/50 mL premix     1 g 100 mL/hr over 30 Minutes Intravenous Every 6 hours 04/20/16 1653 04/21/16 0112   04/20/16 1009  ceFAZolin (ANCEF) IVPB 2g/100 mL premix     2 g 200 mL/hr over 30 Minutes Intravenous On call to O.R. 04/20/16 1009 04/20/16 1230       Patient was given sequential compression devices, early ambulation, and chemoprophylaxis to prevent DVT.  Patient benefited maximally from hospital stay and there were no complications.    Recent vital signs: Patient Vitals for the past 24  hrs:  BP Temp Temp src Pulse Resp SpO2  04/23/16 0447 (!) 106/56 98 F (36.7 C) Oral 100 17 96 %  04/22/16 2134 100/69 98.1 F (36.7 C) Oral (!) 103 17 100 %  04/22/16 1421 120/64 98.3 F (36.8 C) Oral 96 17 96 %     Recent laboratory studies:  Recent Labs  04/21/16 0447 04/22/16 0457  WBC 12.2* 10.0  HGB 11.3* 10.4*  HCT 35.6* 32.3*  PLT 409* 369  NA 131*  --   K 4.5  --   CL 98*  --   CO2 26  --   BUN 16  --   CREATININE 0.73  --   GLUCOSE 186*  --   CALCIUM 8.3*  --      Discharge Medications:     Medication List    STOP taking these medications   enoxaparin 40 MG/0.4ML injection Commonly known as:  LOVENOX   HYDROcodone-acetaminophen 5-325 MG tablet Commonly known as:  NORCO/VICODIN     TAKE these medications   alendronate 70 MG tablet Commonly known as:  FOSAMAX Take 70 mg by mouth every Sunday. Take with a full glass of water on an empty stomach.   ascorbic acid 250 MG tablet Commonly known as:  VITAMIN C Take 250 mg by mouth daily.   aspirin 81 MG chewable tablet Chew 1 tablet (81 mg total) by mouth 2 (two) times daily.   bisacodyl 10 MG suppository Commonly known as:  DULCOLAX Place 1 suppository (10 mg total) rectally daily as needed  for moderate constipation.   feeding supplement (GLUCERNA SHAKE) Liqd Take 237 mLs by mouth 3 (three) times daily between meals.   insulin glargine 100 UNIT/ML injection Commonly known as:  LANTUS Inject 0.1 mLs (10 Units total) into the skin at bedtime.   magnesium oxide 400 MG tablet Commonly known as:  MAG-OX Take 400 mg by mouth daily.   multivitamin-lutein Caps capsule Take 1 capsule by mouth daily.   nicotine 14 mg/24hr patch Commonly known as:  NICODERM CQ - dosed in mg/24 hours Place 14 mg onto the skin daily.   oxyCODONE-acetaminophen 5-325 MG tablet Commonly known as:  ROXICET Take 1-2 tablets by mouth every 4 (four) hours as needed.   polyethylene glycol packet Commonly known as:   MIRALAX / GLYCOLAX Take 17 g by mouth daily.   VITAMIN A PO Take 1 tablet by mouth daily.   vitamin E 400 UNIT capsule Take 400 Units by mouth daily.       Diagnostic Studies: Dg Chest 1 View  Result Date: 03/28/2016 CLINICAL DATA:  Acute onset of shortness of breath. Preoperative radiograph at the right hip. Initial encounter. EXAM: CHEST 1 VIEW COMPARISON:  None. FINDINGS: The lungs are well-aerated. Mild vascular congestion is noted. Mildly increased interstitial markings may reflect mild interstitial edema. There is no evidence of pleural effusion or pneumothorax. The cardiomediastinal silhouette is borderline normal in size. No acute osseous abnormalities are seen. IMPRESSION: Mild vascular congestion noted. Mildly increased interstitial markings may reflect mild interstitial edema. Electronically Signed   By: Garald Balding M.D.   On: 03/28/2016 23:17   Dg Chest 2 View  Result Date: 04/20/2016 CLINICAL DATA:  Preop hip replacement EXAM: CHEST  2 VIEW COMPARISON:  None FINDINGS: The heart size and mediastinal contours are within normal limits. Bronchial wall thickening and coarsened interstitial markings noted. Both lungs are clear. The visualized skeletal structures are unremarkable. IMPRESSION: No active cardiopulmonary disease. Electronically Signed   By: Kerby Moors M.D.   On: 04/20/2016 11:16   Dg Abd 1 View  Result Date: 03/30/2016 CLINICAL DATA:  General abdominal pain with nausea and vomiting for a few days. EXAM: ABDOMEN - 1 VIEW COMPARISON:  CT of the right hip 03/29/2016. FINDINGS: There is a non obstructive bowel gas pattern. No supine evidence of free air. No organomegaly or suspicious calcification.No acute bony abnormality. Hardware noted in the proximal right femur with fractured femoral neck screw, stable since prior CT with femoral neck fracture again noted, stable. This may represent nonunion. IMPRESSION: No acute findings.  No evidence of bowel obstruction or free  air. Electronically Signed   By: Rolm Baptise M.D.   On: 03/30/2016 11:36   Ct Hip Right Wo Contrast  Result Date: 03/29/2016 CLINICAL DATA:  Status post fall in kitchen, with right hip pain. Initial encounter. EXAM: CT OF THE RIGHT HIP WITHOUT CONTRAST TECHNIQUE: Multidetector CT imaging of the right hip was performed according to the standard protocol. Multiplanar CT image reconstructions were also generated. COMPARISON:  None. FINDINGS: Bones/Joint/Cartilage There is a screw fracture through the medial aspect of the patient's right femoral neck screw, with an associated transcervical fracture through the right femoral neck. The medial portion of the screw remains at the right femoral head. There is superior displacement of the distal femur. There is also loosening involving the distal aspect of the patient's intramedullary nail, measuring up to 5 mm medially. There appears to be a chronically displaced greater trochanteric fragment posterior and medial to the proximal  femur. The right femoral head remains seated at the acetabulum. A long osseous fragment is noted inferior to the right femoral neck, of uncertain significance. This may reflect heterotopic bone formation. Ligaments Suboptimally assessed by CT. Muscles and Tendons The visualized musculature is grossly unremarkable in appearance, aside from mild atrophy at the proximal right hamstring. No definite tendon abnormalities are characterized. Soft tissues Scattered vascular calcifications are seen. A small right inguinal hernia is noted, containing only fat. The bladder is moderately distended and grossly unremarkable. Visualized small and large bowel loops are grossly unremarkable. IMPRESSION: 1. Screw fracture through the medial aspect of the right femoral neck screw, with an associated transcervical fracture through the right femoral neck. The medial portion of the screw remains at the right femoral head. Superior displacement of the distal femur.  2. Loosening involving the distal aspect of the right femoral intramedullary nail, measuring up to 5 mm medially. 3. Chronically displaced greater trochanteric fragment noted posterior and medial to the proximal femur. Long osseous fragment inferior to the right femoral neck may reflect heterotopic bone formation. 4. Small right inguinal hernia, containing only fat. 5. Scattered vascular calcifications seen. Electronically Signed   By: Garald Balding M.D.   On: 03/29/2016 02:15   Dg C-arm 61-120 Min-no Report  Result Date: 04/20/2016 CLINICAL DATA: right anterior hip C-ARM 61-120 MINUTES Fluoroscopy was utilized by the requesting physician.  No radiographic interpretation.   Dg Hip Port Unilat With Pelvis 1v Right  Result Date: 04/20/2016 CLINICAL DATA:  Status post right total hip joint replacement. PACU films. EXAM: DG HIP (WITH OR WITHOUT PELVIS) 1V PORT RIGHT COMPARISON:  Intraoperative fluoro spot images of April 20, 2016 FINDINGS: AP and lateral views of the right hip reveal the joint prosthesis. Radiographic positioning of the prosthetic components is good. The native bone is unremarkable. There are surgical skin staples present. IMPRESSION: Status post right total hip joint replacement without evidence of immediate postprocedure complication. Electronically Signed   By: David  Martinique M.D.   On: 04/20/2016 16:38   Dg Hip Operative Unilat With Pelvis Right  Result Date: 04/20/2016 CLINICAL DATA:  Right hip hardware removal and hip replacement. EXAM: DG C-ARM 61-120 MIN-NO REPORT; OPERATIVE RIGHT HIP WITH PELVIS CONTRAST:  None. FLUOROSCOPY TIME:  Fluoroscopy Time:  0 minutes 48 seconds Radiation Exposure Index (if provided by the fluoroscopic device): 2.6 mGy Number of Acquired Spot Images: 4 COMPARISON:  03/28/2016. FINDINGS: Previously identified intra medullary rod and screw have been removed. Total right hip replacement. Hardware intact. Anatomic alignment. IMPRESSION: Interval removal of  prior right hip orthopedic hardware. Interval total right hip replacement with good anatomic alignment. Electronically Signed   By: Marcello Moores  Register   On: 04/20/2016 14:58   Dg Hip Unilat  With Pelvis 2-3 Views Right  Result Date: 03/28/2016 CLINICAL DATA:  Status post fall with right hip pain. EXAM: DG HIP (WITH OR WITHOUT PELVIS) 2-3V RIGHT COMPARISON:  None. FINDINGS: There is a right femoral nail and proximal compression screw. There is a fracture of the compression screw and transcervical fracture of the cervical neck with mild superior displacement of the femoral shaft in coxa vera angulation. Addition, there is a irregularity within the left superior pubic ramus which may represent a nondisplaced fracture. No other fracture is identified. IMPRESSION: Right proximal femur transcervical neck fracture involving bone and compression screw with mild superior displacement of the femoral shaft and coxa vera angulation. Possible nondisplaced fracture of left superior pubic ramus. Electronically Signed   By:  Kristine Garbe M.D.   On: 03/28/2016 22:58    Disposition: 03-Skilled Nursing Facility  Discharge Instructions    Call MD / Call 911    Complete by:  As directed    If you experience chest pain or shortness of breath, CALL 911 and be transported to the hospital emergency room.  If you develope a fever above 101 F, pus (white drainage) or increased drainage or redness at the wound, or calf pain, call your surgeon's office.   Constipation Prevention    Complete by:  As directed    Drink plenty of fluids.  Prune juice may be helpful.  You may use a stool softener, such as Colace (over the counter) 100 mg twice a day.  Use MiraLax (over the counter) for constipation as needed.   Diet - low sodium heart healthy    Complete by:  As directed    Discharge patient    Complete by:  As directed    Increase activity slowly as tolerated    Complete by:  As directed       Follow-up  Information    Mcarthur Rossetti, MD. Schedule an appointment as soon as possible for a visit in 2 week(s).   Specialty:  Orthopedic Surgery Contact information: [address] [place] [address] [phone]            Signed: Mcarthur Rossetti 04/23/2016, 7:02 AM

## 2016-04-24 NOTE — Clinical Social Work Placement (Signed)
   CLINICAL SOCIAL WORK PLACEMENT  NOTE  Date:  04/24/2016  Patient Details  Name: Danielle Sullivan MRN: [id number] Date of Birth: February 12, 1956  Clinical Social Work is seeking post-discharge placement for this patient at the [place] level of care (*CSW will initial, date and re-position this form in  chart as items are completed):  No   Patient/family provided with [facility] Work Department's list of facilities offering this level of care within the geographic area requested by the patient (or if unable, by the patient's family).  Yes   Patient/family informed of their freedom to choose among providers that offer the needed level of care, that participate in Medicare, Medicaid or managed care program needed by the patient, have an available bed and are willing to accept the patient.  Yes   Patient/family informed of [facility]'s ownership interest in [facility] and [facility], as well as of the fact that they are under no obligation to receive care at these facilities.  PASRR submitted to EDS on       PASRR number received on 04/21/16     Existing PASRR number confirmed on       FL2 transmitted to all facilities in geographic area requested by pt/family on 04/21/16     FL2 transmitted to all facilities within larger geographic area on       Patient informed that his/her managed care company has contracts with or will negotiate with certain facilities, including the following:        Yes   Patient/family informed of bed offers received.  Patient chooses bed at  ([facility])     Physician recommends and patient chooses bed at  ([facility])    Patient to be transferred to   on 04/24/16.  Patient to be transferred to facility by [facility]     Patient family notified on 04/24/16 of transfer.  Name of family member notified:  SISTER     PHYSICIAN       Additional Comment: Pt / sister were in agreement with d/c to [facility] on 04/23/16. [facility] transport required. Medical necessity form completed. Pt / sister are aware out of pocket costs may be associated with [facility] transport. D/C summay was sent to SNF for review. Script included in d/c packet. # for report provided to nsg.   _______________________________________________ Luretha Rued, [place]  [phone] 04/24/2016, 8:50 AM

## 2016-05-07 ENCOUNTER — Encounter (INDEPENDENT_AMBULATORY_CARE_PROVIDER_SITE_OTHER): Payer: Self-pay | Admitting: Physician Assistant

## 2016-05-07 ENCOUNTER — Ambulatory Visit (INDEPENDENT_AMBULATORY_CARE_PROVIDER_SITE_OTHER): Payer: Medicaid Other | Admitting: Physician Assistant

## 2016-05-07 ENCOUNTER — Ambulatory Visit (INDEPENDENT_AMBULATORY_CARE_PROVIDER_SITE_OTHER): Payer: Medicaid Other

## 2016-05-07 DIAGNOSIS — Z96641 Presence of right artificial hip joint: Secondary | ICD-10-CM

## 2016-05-07 NOTE — Progress Notes (Signed)
Office Visit Note   Patient: Danielle Sullivan           Date of Birth: August 13, 1955           MRN: [id number] Visit Date: 05/07/2016              Requested by: No referring provider defined for this encounter. PCP: Pcp Not In System   Assessment & Plan: Visit Diagnoses:  1. Presence of right artificial hip joint     Plan: Staples were removed from the incision site Steri-Strips applied. Able to get incision wet. Scar tissue mobilization encouraged. Continue to physical therapy. Follow up in 2 weeks for wound checks. Sooner if there is any signs of infection.  Follow-Up Instructions: Return in about 2 weeks (around 05/21/2016).   Orders:  Orders Placed This Encounter  Procedures  . XR HIP UNILAT W OR W/O PELVIS 2-3 VIEWS RIGHT   No orders of the defined types were placed in this encounter.     Procedures: No procedures performed   Clinical Data: No additional findings.   Subjective: Chief Complaint  Patient presents with  . Right Hip - Routine Post Op    HPI  Miss Danielle Sullivan is doing well she has no complaints. States she's working with therapy at skilled facility and feels things are going well. No shortness breath ,fevers or calf pain  Review of Systems   Objective: Vital Signs: There were no vitals taken for this visit.  Physical Exam  Ortho Exam Right hip incisions with an contact dermatitis secondary to dressings and mildly erythematous secondary to staple irritation. Surgical incisions without any signs of infection. Range of motion of the right hip causes no pain. Calf supple nontender right. Dorsiflexion/Plantarflexion the right ankle intact. Specialty Comments:  No specialty comments available.  Imaging: Xr Hip Unilat W Or W/o Pelvis 2-3 Views Right  Result Date: 05/07/2016 AP pelvis and lateral view of  right hip: No acute fractures. That is post right total hip arthroplasty without complication. Components well seated no signs of loosening or  hardware failure    PMFS History: Patient Active Problem List   Diagnosis Date Noted  . Status post total replacement of right hip 04/20/2016  . 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 Hip fracture ([place]) 03/29/2016  . Diabetes mellitus type 2 in obese ([place]) 03/29/2016  . Closed fracture of right hip [facility])    Past Medical History:  Diagnosis Date  . Diabetes mellitus without complication ([place])    type II  . Displaced fracture of base of neck of right femur, initial encounter for closed fracture ([place])   . Hx of fall   . Muscle weakness (generalized)   . Nicotine dependence   . Obesity     Family History  Problem Relation Age of Onset  . Diabetes Sister     Past Surgical History:  Procedure Laterality Date  . FRACTURE SURGERY  2012   right hip fx surgery  . HARDWARE REMOVAL Right 04/20/2016   Procedure: HARDWARE REMOVAL;  Surgeon: Mcarthur Rossetti, MD;  Location: [facility];  Service: Orthopedics;  Laterality: Right;  . TOTAL HIP ARTHROPLASTY Right 04/20/2016   Procedure: RIGHT TOTAL HIP ARTHROPLASTY ANTERIOR APPROACH;  Surgeon: Mcarthur Rossetti, MD;  Location: [facility];  Service: Orthopedics;  Laterality: Right;   Social History   Occupational History  . Not on file.   Social History Main Topics  .  Smoking status: Current Every Day Smoker    Packs/day: 1.00    Types: Cigarettes  . Smokeless tobacco: Never Used  . Alcohol use No  . Drug use: No  . Sexual activity: Not on file

## 2016-05-21 ENCOUNTER — Ambulatory Visit (INDEPENDENT_AMBULATORY_CARE_PROVIDER_SITE_OTHER): Payer: Medicaid Other | Admitting: Physician Assistant

## 2016-05-21 DIAGNOSIS — Z96641 Presence of right artificial hip joint: Secondary | ICD-10-CM

## 2016-05-21 NOTE — Progress Notes (Signed)
   Post-Op Visit Note   Patient: Danielle Sullivan           Date of Birth: 1955-10-09           MRN: [id number] Visit Date: 05/21/2016 PCP: Pcp Not In System   Assessment & Plan:  Chief Complaint:  Chief Complaint  Patient presents with  . Right Hip - Routine Post Op   Visit Diagnoses:  1. Status post total hip replacement, right     Plan: Continue clindamycin for the duration 10 days total. Wash the incision sites with an antibacterial soap daily. Continue physical therapy for strengthening, gait and balance. We'll see her back in 4 weeks sooner if there is any questions concerns drainage or erythema.  Follow-Up Instructions: Return in about 4 weeks (around 06/18/2016).   Orders:  No orders of the defined types were placed in this encounter.  No orders of the defined types were placed in this encounter.  HPI Patient returns for follow up status post right total hip on 04/20/2016. She states that her hip is doing alright. She does have some pain still. She is in wheelchair today. She states that she has been doing physical therapy and that is going fine as well. States that she did have some redness around the incision sites. However this has improved, is currently on clindamycin. She had no drainage from the wound sites . PMFS History: Patient Active Problem List   Diagnosis Date Noted  . Status post total replacement of right hip 04/20/2016  . 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 Hip fracture ([place]) 03/29/2016  . Diabetes mellitus type 2 in obese ([place]) 03/29/2016  . Closed fracture of right hip [facility])    Past Medical History:  Diagnosis Date  . Diabetes mellitus without complication ([place])    type II  . Displaced fracture of base of neck of right femur, initial encounter for closed fracture ([place])   . Hx of fall   . Muscle weakness (generalized)   . Nicotine dependence   . Obesity     Family History    Problem Relation Age of Onset  . Diabetes Sister     Past Surgical History:  Procedure Laterality Date  . FRACTURE SURGERY  2012   right hip fx surgery  . HARDWARE REMOVAL Right 04/20/2016   Procedure: HARDWARE REMOVAL;  Surgeon: Mcarthur Rossetti, MD;  Location: [facility];  Service: Orthopedics;  Laterality: Right;  . TOTAL HIP ARTHROPLASTY Right 04/20/2016   Procedure: RIGHT TOTAL HIP ARTHROPLASTY ANTERIOR APPROACH;  Surgeon: Mcarthur Rossetti, MD;  Location: [facility];  Service: Orthopedics;  Laterality: Right;   Social History   Occupational History  . Not on file.   Social History Main Topics  . Smoking status: Current Every Day Smoker    Packs/day: 1.00    Types: Cigarettes  . Smokeless tobacco: Never Used  . Alcohol use No  . Drug use: No  . Sexual activity: Not on file

## 2016-06-20 ENCOUNTER — Encounter (INDEPENDENT_AMBULATORY_CARE_PROVIDER_SITE_OTHER): Payer: Self-pay | Admitting: Physician Assistant

## 2016-06-20 ENCOUNTER — Ambulatory Visit (INDEPENDENT_AMBULATORY_CARE_PROVIDER_SITE_OTHER): Payer: Medicaid Other | Admitting: Physician Assistant

## 2016-06-20 DIAGNOSIS — Z96641 Presence of right artificial hip joint: Secondary | ICD-10-CM

## 2016-06-20 MED ORDER — SULFAMETHOXAZOLE-TRIMETHOPRIM 800-160 MG PO TABS: 1.0000 | ORAL_TABLET | Freq: Two times a day (BID) | ORAL | 0 refills | Status: DC

## 2016-06-20 NOTE — Progress Notes (Signed)
   Post-Op Visit Note   Patient: Danielle Sullivan           Date of Birth: 1955/09/14           MRN: [id number] Visit Date: 06/20/2016 PCP: Pcp Not In System   Assessment & Plan:  Chief Complaint:  Chief Complaint  Patient presents with  . Right Hip - Routine Post Op  . Follow-up   Visit Diagnoses:  1. Status post total replacement of right hip    Exam:  She ambulates today with a walker. Overall good range of motion of the right hip. Surgical incisions are healing well no signs of infection. She has a boil on the right buttocks cheek and gluteal fold region.  Plan: We'll place her on Bactrim DS 500 milligrams twice daily for 10 days. Continue physical therapy for range of motion strengthening and gait balance right hip. See her back in 2 weeks for checks of the boils on her buttock. Wash with an antibacterial soap. From a orthopedic standpoint she can be discharged to home with home health physical therapy.  Follow-Up Instructions: Return in about 2 weeks (around 07/04/2016).   Orders:  No orders of the defined types were placed in this encounter.  Meds ordered this encounter  Medications  . DISCONTD: sulfamethoxazole-trimethoprim (BACTRIM DS,SEPTRA DS) 800-160 MG tablet    Sig: Take 1 tablet by mouth 2 (two) times daily.    Dispense:  20 tablet    Refill:  0  . sulfamethoxazole-trimethoprim (BACTRIM DS,SEPTRA DS) 800-160 MG tablet    Sig: Take 1 tablet by mouth 2 (two) times daily.    Dispense:  20 tablet    Refill:  0     PMFS History: Patient Active Problem List   Diagnosis Date Noted  . Status post total replacement of right hip 04/20/2016  . 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 Hip fracture ([place]) 03/29/2016  . Diabetes mellitus type 2 in obese ([place]) 03/29/2016  . Closed fracture of right hip [facility])    Past Medical History:  Diagnosis Date  . Diabetes mellitus without complication ([place])    type II  . Displaced fracture of base of neck of right femur, initial encounter for closed fracture ([place])   . Hx of fall   . Muscle weakness (generalized)   . Nicotine dependence   . Obesity     Family History  Problem Relation Age of Onset  . Diabetes Sister     Past Surgical History:  Procedure Laterality Date  . FRACTURE SURGERY  2012   right hip fx surgery  . HARDWARE REMOVAL Right 04/20/2016   Procedure: HARDWARE REMOVAL;  Surgeon: Mcarthur Rossetti, MD;  [facility];  Service: Orthopedics;  Laterality: Right;  . TOTAL HIP ARTHROPLASTY Right 04/20/2016   Procedure: RIGHT TOTAL HIP ARTHROPLASTY ANTERIOR APPROACH;  Surgeon: Mcarthur Rossetti, MD;  [facility];  Service: Orthopedics;  Laterality: Right;   Social History   Occupational History  . Not on file.   Social History Main Topics  . Smoking status: Current Every Day Smoker    Packs/day: 1.00    Types: Cigarettes  . Smokeless tobacco: Never Used  . Alcohol use No  . Drug use: No  . Sexual activity: Not on file

## 2016-06-21 ENCOUNTER — Telehealth (INDEPENDENT_AMBULATORY_CARE_PROVIDER_SITE_OTHER): Payer: Self-pay | Admitting: Orthopaedic Surgery

## 2016-06-21 NOTE — Telephone Encounter (Signed)
Asking if she is ok to go home WITHOUT HHPT. They said she has Medicaid and they won't cover HHPT.

## 2016-06-21 NOTE — Telephone Encounter (Signed)
Caryl Pina from [facility] called needing clarification on orders for the patient. The number to contact her is [phone]

## 2016-06-22 NOTE — Telephone Encounter (Signed)
Talked with Caryl Pina at [facility] and advised her of message concerning patient being discharged to go home per Erskine Emery and Dr. Ninfa Linden.

## 2016-06-22 NOTE — Telephone Encounter (Signed)
The discharge to home is on them when ever they feel she is ready to discharge from a PT standpoint they can send her home. I checked with Ninfa Linden and he said it up to the skilled facility.

## 2016-07-05 ENCOUNTER — Ambulatory Visit (INDEPENDENT_AMBULATORY_CARE_PROVIDER_SITE_OTHER): Payer: Medicaid Other | Admitting: Physician Assistant

## 2016-12-11 IMAGING — DX DG CHEST 2V
2 series · 2 of 2 positions shown · non-contrast
Comparison: None

CLINICAL DATA: Preop hip replacement

EXAM:
CHEST  2 VIEW

[chest lat]
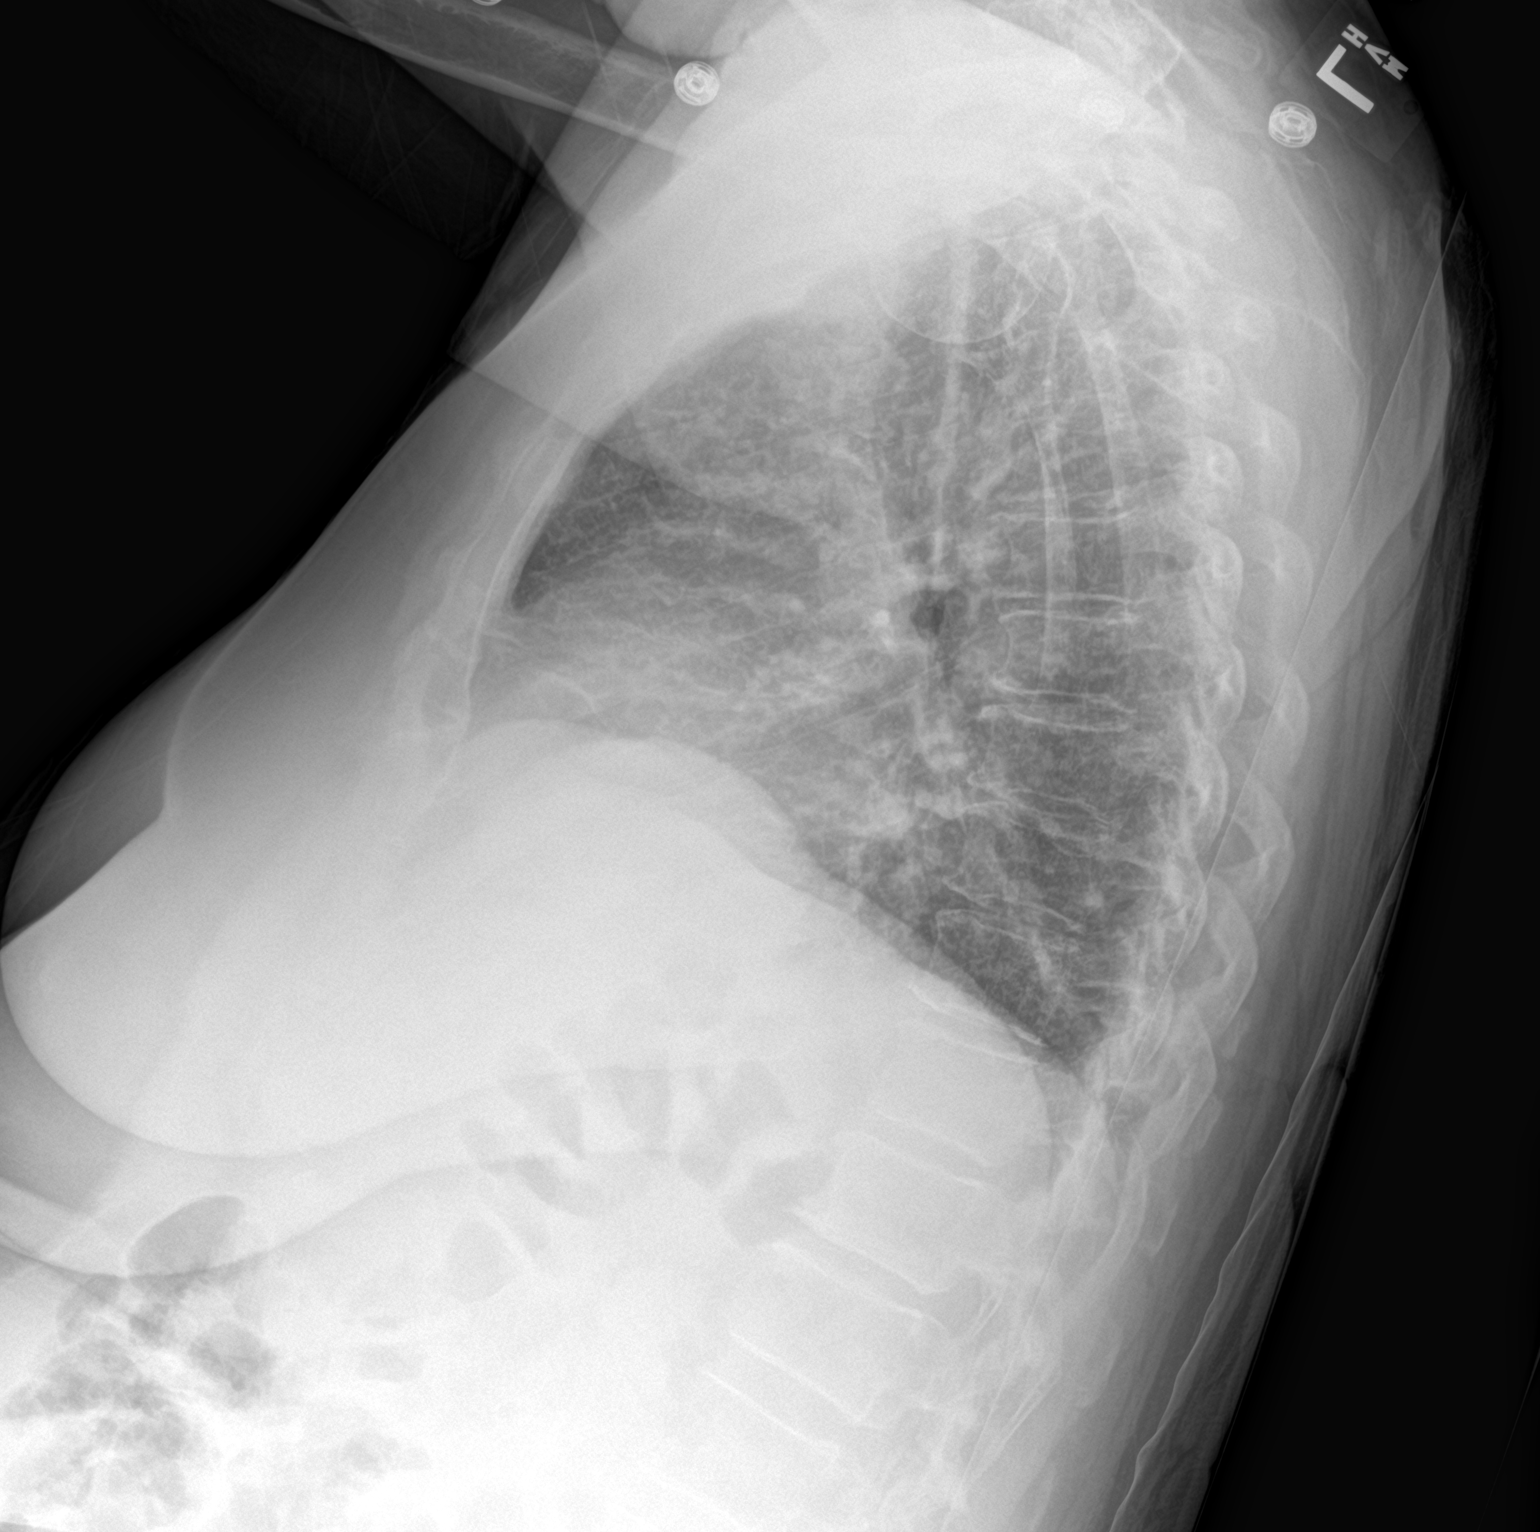

[chest ap]
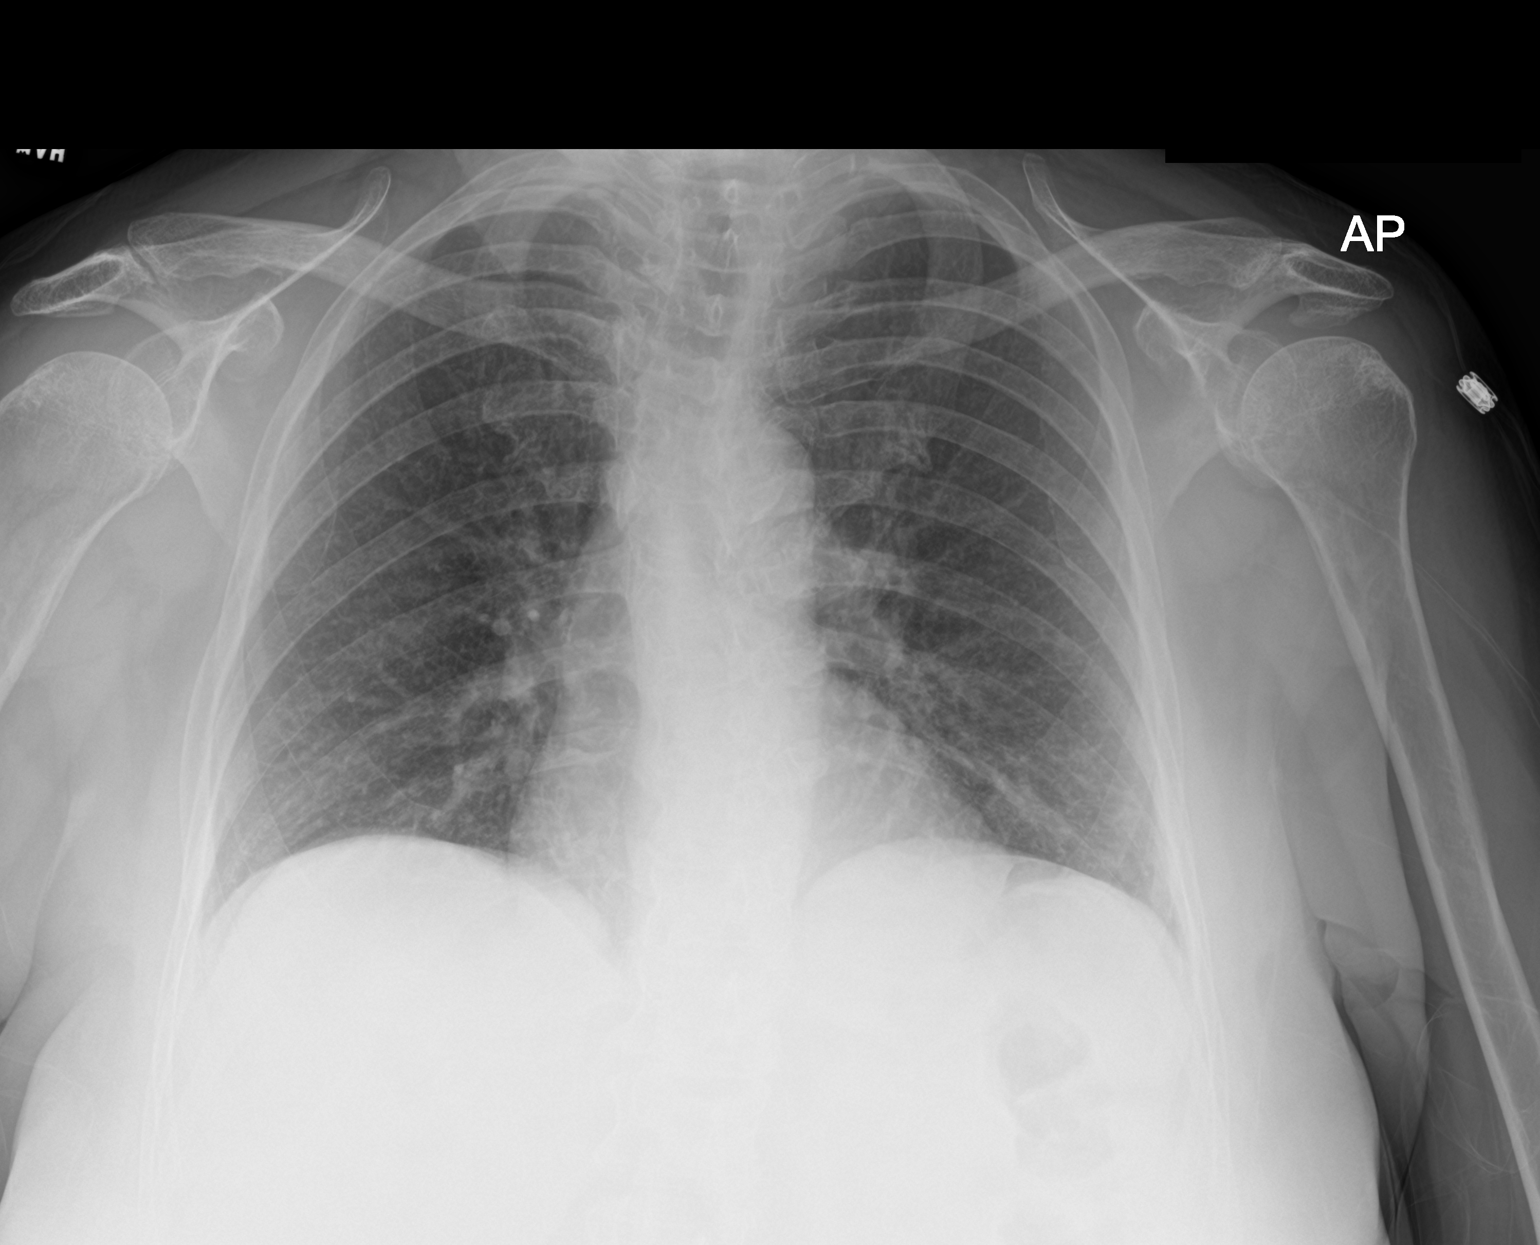

[2 of 2 positions shown; findings below may reference images not displayed]

FINDINGS: The heart size and mediastinal contours are within normal limits.
Bronchial wall thickening and coarsened interstitial markings noted.
Both lungs are clear. The visualized skeletal structures are
unremarkable.
IMPRESSION: No active cardiopulmonary disease.

## 2017-02-07 ENCOUNTER — Encounter (INDEPENDENT_AMBULATORY_CARE_PROVIDER_SITE_OTHER): Payer: Self-pay | Admitting: Physician Assistant

## 2017-02-07 ENCOUNTER — Ambulatory Visit (INDEPENDENT_AMBULATORY_CARE_PROVIDER_SITE_OTHER): Payer: Medicaid Other | Admitting: Physician Assistant

## 2017-02-07 ENCOUNTER — Ambulatory Visit (INDEPENDENT_AMBULATORY_CARE_PROVIDER_SITE_OTHER): Payer: Medicaid Other

## 2017-02-07 ENCOUNTER — Encounter (INDEPENDENT_AMBULATORY_CARE_PROVIDER_SITE_OTHER): Payer: Self-pay

## 2017-02-07 DIAGNOSIS — Z96641 Presence of right artificial hip joint: Secondary | ICD-10-CM | POA: Diagnosis not present

## 2017-02-07 NOTE — Progress Notes (Signed)
Danielle Sullivan returns today 10 months status post right total hip arthroplasty. She states overall she is a doing okay. The she is ambulating with a cane. She denies fevers ,chills ,shortness breath or calf pain. She is at [facility]. She states the boils on her buttocks area resolved she's had no further problems with this.  Review of systems: No chest pain shortness breath calf pain fevers or chills.  Physical exam: General well-developed well-nourished female in no acute distress. Mood and affect appropriate. Bilateral hips good range of motion without pain. She has a good flexion of both hips without pain. She walks with slight antalgic gait and uses cane. Bilateral calves are nontender she has edema bilateral lower legs.  Radiographs AP pelvis and lateral view right hip: Right total hip components all well seated. No signs of hardware failure. No acute fractures. No bony abnormalities.  Impression: 10 months status post right total hip arthroplasty  Lower extremity edema bilateral  Plan: She'll continue to work on overall range of motion of the right hip and strengthening. Discussed with her using compression hose and elevation of her legs. She will see if the center that she states that has any compression hose that she can wear during the day during. Follow-up on an as-needed basis.

## 2019-04-08 ENCOUNTER — Encounter: Payer: Self-pay | Admitting: Gastroenterology

## 2020-01-21 ENCOUNTER — Encounter: Payer: Self-pay | Admitting: Gastroenterology

## 2020-03-11 ENCOUNTER — Ambulatory Visit (AMBULATORY_SURGERY_CENTER): Payer: Self-pay | Admitting: *Deleted

## 2020-03-11 ENCOUNTER — Telehealth: Payer: Self-pay | Admitting: *Deleted

## 2020-03-11 ENCOUNTER — Other Ambulatory Visit: Payer: Self-pay

## 2020-03-11 VITALS — Ht 60.0 in | Wt 200.4 lb

## 2020-03-11 DIAGNOSIS — Z86010 Personal history of colonic polyps: Secondary | ICD-10-CM

## 2020-03-11 DIAGNOSIS — Z85038 Personal history of other malignant neoplasm of large intestine: Secondary | ICD-10-CM

## 2020-03-11 NOTE — Telephone Encounter (Signed)
Dr.Gupta,  This patient is from [facility]. She does use a wheelchair and is able to transfer. She is a smoker, diabetic, hx falls and she uses Oxygen [place] 2 liters everyday and at night. She is for a recall colon hx colon polyps, hx colon cancer.  Last colon with you 2017 polyps. Okay for direct hospital colon or OV? Please advise. Thanks, Takiesha Mcdevitt pv

## 2020-03-11 NOTE — Progress Notes (Signed)
Patient is here in-person for PV via wheelchair. Patient denies any allergies to eggs or soy. Patient denies any problems with anesthesia/sedation. Patient uses oxygen  at home. Patient denies taking any diet/weight loss medications or blood thinners. Patient is not being treated for MRSA or C-diff. Patient is aware of our care-partner policy and [facility]KLK-91 safety protocol.

## 2020-03-21 NOTE — Telephone Encounter (Signed)
Called [facility] and notified the nurse that the colonoscopy is cancelled and made ov with Dr.Gupta for 12/2 at 2:30 pm. I gave her his office address in [place].

## 2020-03-21 NOTE — Telephone Encounter (Signed)
Due to multiple comorbid conditions, lets have OV to make sure that she can handle colon RG

## 2020-03-25 ENCOUNTER — Encounter: Payer: Self-pay | Admitting: Gastroenterology

## 2020-05-19 ENCOUNTER — Ambulatory Visit: Payer: Medicaid Other | Admitting: Gastroenterology

## 2020-07-18 ENCOUNTER — Ambulatory Visit: Payer: Medicaid Other | Admitting: Gastroenterology

## 2020-07-22 ENCOUNTER — Ambulatory Visit (INDEPENDENT_AMBULATORY_CARE_PROVIDER_SITE_OTHER): Payer: Medicaid Other | Admitting: Gastroenterology

## 2020-07-22 ENCOUNTER — Other Ambulatory Visit (INDEPENDENT_AMBULATORY_CARE_PROVIDER_SITE_OTHER): Payer: Medicaid Other

## 2020-07-22 ENCOUNTER — Other Ambulatory Visit: Payer: Self-pay

## 2020-07-22 VITALS — BP 136/88 | HR 107 | Ht 60.0 in | Wt 194.1 lb

## 2020-07-22 DIAGNOSIS — Z85038 Personal history of other malignant neoplasm of large intestine: Secondary | ICD-10-CM

## 2020-07-22 DIAGNOSIS — K5909 Other constipation: Secondary | ICD-10-CM

## 2020-07-22 LAB — CBC WITH DIFFERENTIAL/PLATELET
Basophils Absolute: 0.1 10*3/uL (ref 0.0–0.1)
Basophils Relative: 0.9 % (ref 0.0–3.0)
Eosinophils Absolute: 0.1 10*3/uL (ref 0.0–0.7)
Eosinophils Relative: 1.1 % (ref 0.0–5.0)
HCT: 39.6 % (ref 36.0–46.0)
Hemoglobin: 13.1 g/dL (ref 12.0–15.0)
Lymphocytes Relative: 28.5 % (ref 12.0–46.0)
Lymphs Abs: 3.2 10*3/uL (ref 0.7–4.0)
MCHC: 33.1 g/dL (ref 30.0–36.0)
MCV: 89.7 fl (ref 78.0–100.0)
Monocytes Absolute: 0.8 10*3/uL (ref 0.1–1.0)
Monocytes Relative: 7.4 % (ref 3.0–12.0)
Neutro Abs: 6.9 10*3/uL (ref 1.4–7.7)
Neutrophils Relative %: 62.1 % (ref 43.0–77.0)
Platelets: 330 10*3/uL (ref 150.0–400.0)
RBC: 4.41 Mil/uL (ref 3.87–5.11)
RDW: 15 % (ref 11.5–15.5)
WBC: 11.2 10*3/uL — ABNORMAL HIGH (ref 4.0–10.5)

## 2020-07-22 LAB — COMPREHENSIVE METABOLIC PANEL
ALT: 21 U/L (ref 0–35)
AST: 17 U/L (ref 0–37)
Albumin: 4.1 g/dL (ref 3.5–5.2)
Alkaline Phosphatase: 94 U/L (ref 39–117)
BUN: 22 mg/dL (ref 6–23)
CO2: 34 mEq/L — ABNORMAL HIGH (ref 19–32)
Calcium: 9.8 mg/dL (ref 8.4–10.5)
Chloride: 94 mEq/L — ABNORMAL LOW (ref 96–112)
Creatinine, Ser: 0.83 mg/dL (ref 0.40–1.20)
GFR: 73.5 mL/min (ref >60.00)
Glucose, Bld: 217 mg/dL — ABNORMAL HIGH (ref 70–99)
Potassium: 3.9 mEq/L (ref 3.5–5.1)
Sodium: 137 mEq/L (ref 135–145)
Total Bilirubin: 0.3 mg/dL (ref 0.2–1.2)
Total Protein: 6.9 g/dL (ref 6.0–8.3)

## 2020-07-22 MED ORDER — LUBIPROSTONE 8 MCG PO CAPS: 8.0000 ug | ORAL_CAPSULE | Freq: Two times a day (BID) | ORAL | 6 refills | Status: AC

## 2020-07-22 MED ORDER — LUBIPROSTONE 8 MCG PO CAPS: 8.0000 ug | ORAL_CAPSULE | Freq: Two times a day (BID) | ORAL | 6 refills | Status: DC

## 2020-07-22 NOTE — Progress Notes (Signed)
Chief Complaint: Abdo pain.  Referring Provider:  No ref. provider found      ASSESSMENT AND PLAN;   #1. Chronic constipation with LLQ pain  #2. H/O colon cancer (stage I, T2N0M0)  02/2011 s/p R hemicolectomy.  Subsequent colon 01/2016 showed colonic polyp s/p polypectomy, mild diverticulosis.  Had elevated CEA level in past  #3. Umblical hernia- asymptomatic.  Recommend conservative management unless she starts having pain or s/s of obstruction.  #4. Assoc multiple comorbid conditions including COPD with continued smoking on home O2, cor pulmonale, DM2, obesity, HTN, SZ disorder, OA   Plan: -Proceed with colon at [facility] April.  High risk patient. -Continue miralax 17g po bid. -Amitiza 8 mcg po bid with meals. -CBC, CMP, CEA (add on to blood work done this AM)   HPI:    Danielle Sullivan is a 65 y.o. female  [facility] resident with multiple comorbidities  Sent to GI clinic for colonoscopy evaluation.  LLQ pain, with constipation (used to have diarrhea several years ago).  Constipation is despite MiraLAX twice a day.  She does complain of mild abdominal bloating.  The abdominal pain does get better with defecation.  She underwent x-ray KUB which showed significant constipation and has been advised GI work-up to rule out any recurrence.  No melena or hematochezia.  Denies having any upper GI symptoms including nausea, vomiting, heartburn, regurgitation, odynophagia or dysphagia.  Continues to smoke  SH: Moved from [place].  Heavy smoker since 70s.  Past GI procedures: -Colonoscopy 01/2016 ([facility]): Colonic polyp s/p polypectomy, mild pancolonic diverticulosis, s/p right hemicolectomy.  Otherwise normal to anastomosis.  Bx-tubular adenoma. Past Medical History:  Diagnosis Date  . Colon cancer ([place]) 02/2011  . COPD (chronic obstructive pulmonary disease) ([place])   . Depression   . Diabetes mellitus without complication ([place])    type II  . Displaced fracture of base of neck of right  femur, initial encounter for closed fracture ([place])   . Falls   . 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 Hx of fall   . Hypertension   . Muscle weakness (generalized)   . Nicotine dependence   . Obesity   . Osteoarthritis   . Oxygen deficiency   . Seizures ([place])    as child    Past Surgical History:  Procedure Laterality Date  . COLON SURGERY  2012   right hemicolectomy   . COLONOSCOPY  02/08/2016   Colonic polyp status post polypectomy. Mild pancolonic diverticulosis. Otherwise normal postoperative colonoscopy  . FRACTURE SURGERY  2012   right hip fx surgery  . HARDWARE REMOVAL Right 04/20/2016   Procedure: HARDWARE REMOVAL;  Surgeon: Mcarthur Rossetti, MD;  [facility];  Service: Orthopedics;  Laterality: Right;  . HIP SURGERY Right 06/2010   due to fracture  . TONSILLECTOMY    . TOTAL HIP ARTHROPLASTY Right 04/20/2016   Procedure: RIGHT TOTAL HIP ARTHROPLASTY ANTERIOR APPROACH;  Surgeon: Mcarthur Rossetti, MD;  [facility];  Service: Orthopedics;  Laterality: Right;    Family History  Problem Relation Age of Onset  . Diabetes Sister   . Colon cancer Neg Hx   . Colon polyps Neg Hx   . Esophageal cancer Neg Hx   . Rectal cancer Neg Hx   . Stomach cancer Neg Hx     Social History   Tobacco Use  . Smoking status: Current Every Day Smoker    Packs/day: 1.00  Types: Cigarettes  . Smokeless tobacco: Never Used  Vaping Use  . Vaping Use: Never used  Substance Use Topics  . Alcohol use: No  . Drug use: No    Current Outpatient Medications  Medication Sig Dispense Refill  . alendronate (FOSAMAX) 70 MG tablet Take 70 mg by mouth every Sunday. Take with a full glass of water on an empty stomach.    Marland Kitchen ascorbic acid (VITAMIN C) 250 MG tablet Take 250 mg by mouth daily.    Marland Kitchen aspirin 81 MG chewable tablet Chew 1 tablet (81 mg total) by mouth 2 (two) times daily. 60 tablet 0  . bisacodyl (DULCOLAX) 10 MG suppository Place 1  suppository (10 mg total) rectally daily as needed for moderate constipation. 12 suppository 0  . calcium carbonate (OSCAL) 1500 (600 Ca) MG TABS tablet Take 1 tablet by mouth daily.    . Cholecalciferol 25 MCG (1000 UT) tablet Take by mouth.    . feeding supplement, GLUCERNA SHAKE, (GLUCERNA SHAKE) LIQD Take 237 mLs by mouth 3 (three) times daily between meals. 30 Can 0  . insulin glargine (LANTUS) 100 UNIT/ML injection Inject 0.1 mLs (10 Units total) into the skin at bedtime. 10 mL 11  . magnesium oxide (MAG-OX) 400 MG tablet Take 400 mg by mouth daily.    . metFORMIN (GLUCOPHAGE) 500 MG tablet Take by mouth.    . multivitamin-lutein (OCUVITE-LUTEIN) CAPS capsule Take 1 capsule by mouth daily.    . nicotine (NICODERM CQ - DOSED IN MG/24 HOURS) 14 mg/24hr patch Place 14 mg onto the skin daily.    Marland Kitchen oxyCODONE-acetaminophen (ROXICET) 5-325 MG tablet Take 1-2 tablets by mouth every 4 (four) hours as needed. 60 tablet 0  . polyethylene glycol (MIRALAX / GLYCOLAX) packet Take 17 g by mouth daily. 14 each 0  . sulfamethoxazole-trimethoprim (BACTRIM DS,SEPTRA DS) 800-160 MG tablet Take 1 tablet by mouth 2 (two) times daily. 20 tablet 0  . VITAMIN A PO Take 1 tablet by mouth daily.    . vitamin E 400 UNIT capsule Take 400 Units by mouth daily.     No current facility-administered medications for this visit.    Allergies  Allergen Reactions  . [place]) Allergy Skin Test Rash  . [place] Rash    Review of Systems:  Constitutional: Denies fever, chills, diaphoresis, appetite change and has fatigue.  HEENT: Denies photophobia, eye pain, redness, hearing loss, ear pain, congestion, sore throat, rhinorrhea, sneezing, mouth sores, neck pain, neck stiffness and tinnitus.   Respiratory: Has SOB, DOE, cough, chest tightness,  and wheezing.   Cardiovascular: Denies chest pain, palpitations and leg swelling.  Genitourinary: Denies dysuria, urgency, frequency, hematuria, flank pain and difficulty  urinating.  Musculoskeletal: Denies myalgias, back pain, joint swelling, arthralgias and gait problem.  Skin: No rash.  Neurological: Denies dizziness, seizures, syncope, weakness, light-headedness, numbness and headaches.  Hematological: Denies adenopathy. Easy bruising, personal or family bleeding history  Psychiatric/Behavioral: Has anxiety or depression     Physical Exam:    BP 136/88   Pulse (!) 107   Ht 5' (1.524 m)   Wt 194 lb 2 oz (88.1 kg)   BMI 37.91 kg/m  Wt Readings from Last 3 Encounters:  07/22/20 194 lb 2 oz (88.1 kg)  03/11/20 200 lb 6.4 oz (90.9 kg)  04/20/16 183 lb 1 oz (83 kg)   Constitutional:  Well-developed, in no acute distress. Psychiatric: Normal mood and affect. Behavior is normal. HEENT: Pupils normal.  Conjunctivae are normal. No  scleral icterus. Cardiovascular: Normal rate, regular rhythm. No edema Pulmonary/chest: Bilateral decreased breath sounds.  No wheezing, rales or rhonchi. Abdominal: Soft, nondistended. Nontender. Bowel sounds active throughout. There are no masses palpable. No hepatomegaly.  2 cm umbilical hernia.  Nontender.  Well-healed surgical scar. Rectal: Deferred Neurological: Alert and oriented to person place and time. Skin: Skin is warm and dry. No rashes noted. Extremities 1+ edema.  Data Reviewed: I have personally reviewed following labs and imaging studies  CBC: CBC Latest Ref Rng & Units 04/22/2016 04/21/2016 03/30/2016  WBC 4.0 - 10.5 K/uL 10.0 12.2(H) 13.7(H)  Hemoglobin 12.0 - 15.0 g/dL 10.4(L) 11.3(L) 13.2  Hematocrit 36.0 - 46.0 % 32.3(L) 35.6(L) 40.1  Platelets 150 - 400 K/uL 369 409(H) 308    CMP: CMP Latest Ref Rng & Units 04/21/2016 03/30/2016 03/29/2016  Glucose 65 - 99 mg/dL 186(H) - 155(H)  BUN 6 - 20 mg/dL 16 - 16  Creatinine 0.44 - 1.00 mg/dL 0.73 - 0.57  Sodium 135 - 145 mmol/L 131(L) - 136  Potassium 3.5 - 5.1 mmol/L 4.5 - 4.0  Chloride 101 - 111 mmol/L 98(L) - 101  CO2 22 - 32 mmol/L 26 - 26   Calcium 8.9 - 10.3 mg/dL 8.3(L) - 9.0  Total Protein 6.5 - 8.1 g/dL - 6.5 -  Total Bilirubin 0.3 - 1.2 mg/dL - 0.3 -  Alkaline Phos 38 - 126 U/L - 56 -  AST 15 - 41 U/L - 21 -  ALT 14 - 54 U/L - 16 -      Carmell Austria, MD 07/22/2020, 10:26 AM  Cc: No ref. provider found

## 2020-07-22 NOTE — Patient Instructions (Signed)
If you are age 67 or older, your body mass index should be between 23-30. Your Body mass index is 37.91 kg/m. If this is out of the aforementioned range listed, please consider follow up with your Primary Care Provider.  If you are age 50 or younger, your body mass index should be between 19-25. Your Body mass index is 37.91 kg/m. If this is out of the aformentioned range listed, please consider follow up with your Primary Care Provider.   It has been recommended to you by your physician that you have a(n) Colonoscopy completed at [facility]. We did not schedule this today, we will contact you to have this scheduled.   Please go to the lab on the 2nd floor [address] before you leave the office today.   We have sent the following medications to your pharmacy for you to pick up at your convenience: Amitiza 26mcg  Thank you,  Dr. Jackquline Denmark

## 2020-07-23 LAB — CEA: CEA: 4.4 ng/mL — ABNORMAL HIGH

## 2020-08-11 ENCOUNTER — Telehealth: Payer: Self-pay | Admitting: Gastroenterology

## 2020-08-11 ENCOUNTER — Other Ambulatory Visit: Payer: Self-pay | Admitting: Gastroenterology

## 2020-08-11 DIAGNOSIS — Z85038 Personal history of other malignant neoplasm of large intestine: Secondary | ICD-10-CM

## 2020-08-11 DIAGNOSIS — K5909 Other constipation: Secondary | ICD-10-CM

## 2020-08-11 MED ORDER — CLENPIQ 10-3.5-12 MG-GM -GM/160ML PO SOLN: 1.0000 | Freq: Once | ORAL | 0 refills | Status: AC

## 2020-08-11 NOTE — Telephone Encounter (Signed)
Spoke to [place] at [facility]. Patient is scheduled for a colonoscopy on 09/20/20 at [facility]. The instructions have been faxed to the facility number [phone]. They will call us with any questions.

## 2020-08-11 NOTE — Telephone Encounter (Signed)
[facility] center calling to schedule colonoscopy at the hospital per Dr. Lyndel Safe.

## 2020-09-16 ENCOUNTER — Other Ambulatory Visit (HOSPITAL_COMMUNITY)
Admission: RE | Admit: 2020-09-16 | Discharge: 2020-09-16 | Disposition: A | Discharge status: Home / Self Care | Payer: Medicaid Other | Source: Ambulatory Visit | Attending: Gastroenterology | Admitting: Gastroenterology

## 2020-09-16 DIAGNOSIS — Z01812 Encounter for preprocedural laboratory examination: Secondary | ICD-10-CM | POA: Diagnosis present

## 2020-09-16 DIAGNOSIS — Z20822 Contact with and (suspected) exposure to covid-19: Secondary | ICD-10-CM | POA: Insufficient documentation

## 2020-09-16 LAB — SARS CORONAVIRUS 2 (TAT 6-24 HRS): SARS Coronavirus 2: NEGATIVE

## 2020-09-16 NOTE — Progress Notes (Signed)
Preop instructions for:   Danielle Sullivan Date of Birth:  22-Oct-1955                     Date of Procedure:  09/20/2020 Procedure:    COLONOSCOPY WITH PROPOFOL   Surgeon: Dr. Jackquline Denmark Facility contact:  [place], [place]   Phone: [place]: RN contact name/phone#: Edmon Crape                         and Fax #: [phone]   Transportation contact phone#: [employer] [place]    Time to arrive at [facility]: 7:45 AM   Report to: Admitting (On your left hand side)    Do not eat or drink past midnight the night before your procedure.(To include any tube feedings-must be discontinued)   Take these morning medications only with sips of water.(or give through gastrostomy or feeding tube). NONE   The Day Before Surgery: Take 1/2 (half) Lantus dose at bedtime   Note: No Insulin or Diabetic meds should be given or taken the morning of the procedure!     Please send day of procedure:current med list and meds last taken that day, confirm nothing by mouth status from what time, Patient Demographic info( to include DNR status, problem list, allergies)   Bring Insurance card and picture ID Leave all jewelry and other valuables at place where living( no metal or rings to be worn) No contact lens Women-no make-up, no lotions,perfumes,powders    Any questions day of procedure,call  SHORT [phone]     Sent from :[facility]                   Phone:[phone]                   [phone]   Sent by :   Harlon Flor BSN, RN

## 2020-09-20 ENCOUNTER — Other Ambulatory Visit: Payer: Self-pay

## 2020-09-20 ENCOUNTER — Ambulatory Visit (HOSPITAL_COMMUNITY): Payer: Medicaid Other | Admitting: Anesthesiology

## 2020-09-20 ENCOUNTER — Ambulatory Visit (HOSPITAL_COMMUNITY)
Admission: RE | Admit: 2020-09-20 | Discharge: 2020-09-20 | Disposition: A | Discharge status: Home / Self Care | Payer: Medicaid Other | Source: Ambulatory Visit | Attending: Gastroenterology | Admitting: Gastroenterology

## 2020-09-20 ENCOUNTER — Encounter (HOSPITAL_COMMUNITY)
Admission: RE | Disposition: A | Discharge status: Home / Self Care | Payer: Self-pay | Source: Ambulatory Visit | Attending: Gastroenterology

## 2020-09-20 ENCOUNTER — Telehealth: Payer: Self-pay | Admitting: Gastroenterology

## 2020-09-20 ENCOUNTER — Encounter (HOSPITAL_COMMUNITY): Payer: Self-pay | Admitting: Gastroenterology

## 2020-09-20 DIAGNOSIS — I2781 Cor pulmonale (chronic): Secondary | ICD-10-CM | POA: Insufficient documentation

## 2020-09-20 DIAGNOSIS — K5730 Diverticulosis of large intestine without perforation or abscess without bleeding: Secondary | ICD-10-CM | POA: Insufficient documentation

## 2020-09-20 DIAGNOSIS — Z85038 Personal history of other malignant neoplasm of large intestine: Secondary | ICD-10-CM | POA: Diagnosis not present

## 2020-09-20 DIAGNOSIS — R970 Elevated carcinoembryonic antigen [CEA]: Secondary | ICD-10-CM

## 2020-09-20 DIAGNOSIS — E119 Type 2 diabetes mellitus without complications: Secondary | ICD-10-CM | POA: Insufficient documentation

## 2020-09-20 DIAGNOSIS — Z794 Long term (current) use of insulin: Secondary | ICD-10-CM | POA: Diagnosis not present

## 2020-09-20 DIAGNOSIS — K621 Rectal polyp: Secondary | ICD-10-CM

## 2020-09-20 DIAGNOSIS — Z6838 Body mass index (BMI) 38.0-38.9, adult: Secondary | ICD-10-CM | POA: Insufficient documentation

## 2020-09-20 DIAGNOSIS — R1032 Left lower quadrant pain: Secondary | ICD-10-CM | POA: Diagnosis not present

## 2020-09-20 DIAGNOSIS — K429 Umbilical hernia without obstruction or gangrene: Secondary | ICD-10-CM | POA: Diagnosis not present

## 2020-09-20 DIAGNOSIS — Z9049 Acquired absence of other specified parts of digestive tract: Secondary | ICD-10-CM | POA: Insufficient documentation

## 2020-09-20 DIAGNOSIS — K5909 Other constipation: Secondary | ICD-10-CM | POA: Diagnosis not present

## 2020-09-20 DIAGNOSIS — G40909 Epilepsy, unspecified, not intractable, without status epilepticus: Secondary | ICD-10-CM | POA: Diagnosis not present

## 2020-09-20 DIAGNOSIS — F17210 Nicotine dependence, cigarettes, uncomplicated: Secondary | ICD-10-CM | POA: Diagnosis not present

## 2020-09-20 DIAGNOSIS — I10 Essential (primary) hypertension: Secondary | ICD-10-CM | POA: Insufficient documentation

## 2020-09-20 DIAGNOSIS — K648 Other hemorrhoids: Secondary | ICD-10-CM | POA: Diagnosis not present

## 2020-09-20 DIAGNOSIS — J449 Chronic obstructive pulmonary disease, unspecified: Secondary | ICD-10-CM | POA: Diagnosis not present

## 2020-09-20 DIAGNOSIS — Z9981 Dependence on supplemental oxygen: Secondary | ICD-10-CM | POA: Insufficient documentation

## 2020-09-20 DIAGNOSIS — E669 Obesity, unspecified: Secondary | ICD-10-CM | POA: Diagnosis not present

## 2020-09-20 DIAGNOSIS — Z833 Family history of diabetes mellitus: Secondary | ICD-10-CM | POA: Diagnosis not present

## 2020-09-20 DIAGNOSIS — M1990 Unspecified osteoarthritis, unspecified site: Secondary | ICD-10-CM | POA: Insufficient documentation

## 2020-09-20 HISTORY — PX: COLONOSCOPY WITH PROPOFOL: SHX5780

## 2020-09-20 HISTORY — PX: POLYPECTOMY: SHX5525

## 2020-09-20 LAB — GLUCOSE, CAPILLARY
Glucose-Capillary: 285 mg/dL — ABNORMAL HIGH (ref 70–99)
Glucose-Capillary: 285 mg/dL — ABNORMAL HIGH (ref 70–99)
Glucose-Capillary: 325 mg/dL — ABNORMAL HIGH (ref 70–99)
Glucose-Capillary: 289 mg/dL — ABNORMAL HIGH (ref 70–99)

## 2020-09-20 SURGERY — COLONOSCOPY WITH PROPOFOL
Anesthesia: Monitor Anesthesia Care

## 2020-09-20 MED ORDER — PROPOFOL 500 MG/50ML IV EMUL
INTRAVENOUS | Status: DC | PRN
  Administered 2020-09-20: 125 ug/kg/min via INTRAVENOUS

## 2020-09-20 MED ORDER — INSULIN ASPART 100 UNIT/ML ~~LOC~~ SOLN
10.0000 [IU] | Freq: Once | SUBCUTANEOUS | Status: AC
  Administered 2020-09-20: 10 [IU] via SUBCUTANEOUS

## 2020-09-20 MED ORDER — PROPOFOL 500 MG/50ML IV EMUL
INTRAVENOUS | Status: AC
  Filled 2020-09-20: qty 50

## 2020-09-20 MED ORDER — POLYETHYLENE GLYCOL 3350 17 G PO PACK: 17.0000 g | PACK | Freq: Every day | ORAL | 11 refills | Status: AC

## 2020-09-20 MED ORDER — INSULIN ASPART 100 UNIT/ML ~~LOC~~ SOLN
SUBCUTANEOUS | Status: AC
  Filled 2020-09-20: qty 1

## 2020-09-20 MED ORDER — LACTATED RINGERS IV SOLN: INTRAVENOUS | Status: DC

## 2020-09-20 MED ORDER — LIDOCAINE 2% (20 MG/ML) 5 ML SYRINGE
INTRAMUSCULAR | Status: DC | PRN
  Administered 2020-09-20: 50 mg via INTRAVENOUS

## 2020-09-20 MED ORDER — SODIUM CHLORIDE 0.9 % IV SOLN: INTRAVENOUS | Status: DC

## 2020-09-20 MED ORDER — PROPOFOL 10 MG/ML IV BOLUS
INTRAVENOUS | Status: DC | PRN
  Administered 2020-09-20 (×2): 20 mg via INTRAVENOUS

## 2020-09-20 SURGICAL SUPPLY — 21 items

## 2020-09-20 NOTE — Transfer of Care (Signed)
Immediate Anesthesia Transfer of Care Note  Patient: Danielle Sullivan  Procedure(s) Performed: COLONOSCOPY WITH PROPOFOL (N/A ) BIOPSY  Patient Location: PACU  Anesthesia Type:MAC  Level of Consciousness: awake, alert  and oriented  Airway & Oxygen Therapy: Patient Spontanous Breathing and Patient connected to face mask oxygen  Post-op Assessment: Report given to RN and Post -op Vital signs reviewed and stable  Post vital signs: Reviewed and stable  Last Vitals:  Vitals Value Taken Time  BP    Temp    Pulse 88 09/20/20 0947  Resp 19 09/20/20 0947  SpO2 99 % 09/20/20 0947  Vitals shown include unvalidated device data.  Last Pain:  Vitals:   09/20/20 0832  TempSrc: Temporal         Complications: No complications documented.

## 2020-09-20 NOTE — Anesthesia Postprocedure Evaluation (Signed)
Anesthesia Post Note  Patient: Sanora Cunanan  Procedure(s) Performed: COLONOSCOPY WITH PROPOFOL (N/A ) BIOPSY     Patient location during evaluation: PACU Anesthesia Type: MAC Level of consciousness: awake and alert Pain management: pain level controlled Vital Signs Assessment: post-procedure vital signs reviewed and stable Respiratory status: spontaneous breathing, nonlabored ventilation and respiratory function stable Cardiovascular status: blood pressure returned to baseline and stable Postop Assessment: no apparent nausea or vomiting Anesthetic complications: no   No complications documented.  Last Vitals:  Vitals:   09/20/20 0948 09/20/20 0950  BP: (!) 121/93 127/70  Pulse: 89 91  Resp: 20 (!) 22  Temp: 36.7 C   SpO2: 98% 98%    Last Pain:  Vitals:   09/20/20 0950  TempSrc:   PainSc: 0-No pain                 Pervis Hocking

## 2020-09-20 NOTE — Anesthesia Preprocedure Evaluation (Addendum)
Anesthesia Evaluation  Patient identified by MRN, date of birth, ID band Patient awake    Reviewed: Allergy & Precautions, NPO status , Patient's Chart, lab work & pertinent test results  Airway Mallampati: III  TM Distance: >3 FB Neck ROM: Full    Dental no notable dental hx. (+) Edentulous Upper, Edentulous Lower   Pulmonary COPD (occasionally uses oxygen at facility), Current Smoker and Patient abstained from smoking.,  Smokes about 1ppd for very long time Per pt does not use inhalers and does not have pulmonologist    + decreased breath sounds      Cardiovascular hypertension, Pt. on medications Normal cardiovascular exam Rhythm:Regular Rate:Normal     Neuro/Psych Seizures - (childhood), Well Controlled,  PSYCHIATRIC DISORDERS Depression    GI/Hepatic Neg liver ROS, Hx CRC Chronic constipation   Endo/Other  diabetes, Poorly Controlled, Type 2, Insulin DependentObesity BMI 39 a1c 8.1 Took long-acting insulin last night, but FS still 285 in preop  Renal/GU negative Renal ROS  negative genitourinary   Musculoskeletal  (+) Arthritis , Osteoarthritis,    Abdominal (+) + obese,   Peds  Hematology negative hematology ROS (+)   Anesthesia Other Findings   Reproductive/Obstetrics negative OB ROS                          Anesthesia Physical Anesthesia Plan  ASA: III  Anesthesia Plan: MAC   Post-op Pain Management:    Induction:   PONV Risk Score and Plan: 2 and Propofol infusion and TIVA  Airway Management Planned: Natural Airway and Simple Face Mask  Additional Equipment: None  Intra-op Plan:   Post-operative Plan:   Informed Consent: I have reviewed the patients History and Physical, chart, labs and discussed the procedure including the risks, benefits and alternatives for the proposed anesthesia with the patient or authorized representative who has indicated his/her understanding  and acceptance.       Plan Discussed with: CRNA  Anesthesia Plan Comments: (Hyperglycemia treated in preop)      Anesthesia Quick Evaluation

## 2020-09-20 NOTE — Telephone Encounter (Signed)
Order in epic for istat and CT a/p. Rad scheduling notified and they will contact pt regarding the appt.

## 2020-09-20 NOTE — H&P (Signed)
For colon Explained risks and benefits RG    Chief Complaint: Abdo pain.  Referring Provider:  No ref. provider found      ASSESSMENT AND PLAN;   #1. Chronic constipation with LLQ pain  #2. H/O colon cancer (stage I, T2N0M0)  02/2011 s/p R hemicolectomy.  Subsequent colon 01/2016 showed colonic polyp s/p polypectomy, mild diverticulosis.  Had elevated CEA level in past  #3. Umblical hernia- asymptomatic.  Recommend conservative management unless she starts having pain or s/s of obstruction.  #4. Assoc multiple comorbid conditions including COPD with continued smoking on home O2, cor pulmonale, DM2, obesity, HTN, SZ disorder, OA   Plan: -Proceed with colon at [facility] April.  High risk patient. -Continue miralax 17g po bid. -Amitiza 8 mcg po bid with meals. -CBC, CMP, CEA (add on to blood work done this AM)   HPI:    Danielle Sullivan is a 65 y.o. female  [facility] with multiple comorbidities  Sent to GI clinic for colonoscopy evaluation.  LLQ pain, with constipation (used to have diarrhea several years ago).  Constipation is despite MiraLAX twice a day.  She does complain of mild abdominal bloating.  The abdominal pain does get better with defecation.  She underwent x-ray KUB which showed significant constipation and has been advised GI work-up to rule out any recurrence.  No melena or hematochezia.  Denies having any upper GI symptoms including nausea, vomiting, heartburn, regurgitation, odynophagia or dysphagia.  Continues to smoke  SH: Moved from [place].  Heavy smoker since 57s.  Past GI procedures: -Colonoscopy 01/2016 ([facility]): Colonic polyp s/p polypectomy, mild pancolonic diverticulosis, s/p right hemicolectomy.  Otherwise normal to anastomosis.  Bx-tubular adenoma.     Past Medical History:  Diagnosis Date  . Colon cancer ([place]) 02/2011  . COPD (chronic obstructive pulmonary disease) ([place])   . Depression   . Diabetes mellitus without  complication ([place])    type II  . Displaced fracture of base of neck of right femur, initial encounter for closed fracture ([place])   . Falls   . 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 Hx of fall   . Hypertension   . Muscle weakness (generalized)   . Nicotine dependence   . Obesity   . Osteoarthritis   . Oxygen deficiency   . Seizures ([place])    as child         Past Surgical History:  Procedure Laterality Date  . COLON SURGERY  2012   right hemicolectomy   . COLONOSCOPY  02/08/2016   Colonic polyp status post polypectomy. Mild pancolonic diverticulosis. Otherwise normal postoperative colonoscopy  . FRACTURE SURGERY  2012   right hip fx surgery  . HARDWARE REMOVAL Right 04/20/2016   Procedure: HARDWARE REMOVAL;  Surgeon: Mcarthur Rossetti, MD;  Location: [facility];  Service: Orthopedics;  Laterality: Right;  . HIP SURGERY Right 06/2010   due to fracture  . TONSILLECTOMY    . TOTAL HIP ARTHROPLASTY Right 04/20/2016   Procedure: RIGHT TOTAL HIP ARTHROPLASTY ANTERIOR APPROACH;  Surgeon: Mcarthur Rossetti, MD;  Location: [facility];  Service: Orthopedics;  Laterality: Right;         Family History  Problem Relation Age of Onset  . Diabetes Sister   . Colon cancer Neg Hx   . Colon polyps Neg Hx   . Esophageal cancer Neg Hx   . Rectal cancer Neg Hx   . Stomach cancer Neg Hx  Social History   Tobacco Use  . Smoking status: Current Every Day Smoker    Packs/day: 1.00    Types: Cigarettes  . Smokeless tobacco: Never Used  Vaping Use  . Vaping Use: Never used  Substance Use Topics  . Alcohol use: No  . Drug use: No          Current Outpatient Medications  Medication Sig Dispense Refill  . alendronate (FOSAMAX) 70 MG tablet Take 70 mg by mouth every Sunday. Take with a full glass of water on an empty stomach.    Marland Kitchen ascorbic acid (VITAMIN C) 250 MG tablet Take 250 mg by mouth daily.    Marland Kitchen  aspirin 81 MG chewable tablet Chew 1 tablet (81 mg total) by mouth 2 (two) times daily. 60 tablet 0  . bisacodyl (DULCOLAX) 10 MG suppository Place 1 suppository (10 mg total) rectally daily as needed for moderate constipation. 12 suppository 0  . calcium carbonate (OSCAL) 1500 (600 Ca) MG TABS tablet Take 1 tablet by mouth daily.    . Cholecalciferol 25 MCG (1000 UT) tablet Take by mouth.    . feeding supplement, GLUCERNA SHAKE, (GLUCERNA SHAKE) LIQD Take 237 mLs by mouth 3 (three) times daily between meals. 30 Can 0  . insulin glargine (LANTUS) 100 UNIT/ML injection Inject 0.1 mLs (10 Units total) into the skin at bedtime. 10 mL 11  . magnesium oxide (MAG-OX) 400 MG tablet Take 400 mg by mouth daily.    . metFORMIN (GLUCOPHAGE) 500 MG tablet Take by mouth.    . multivitamin-lutein (OCUVITE-LUTEIN) CAPS capsule Take 1 capsule by mouth daily.    . nicotine (NICODERM CQ - DOSED IN MG/24 HOURS) 14 mg/24hr patch Place 14 mg onto the skin daily.    Marland Kitchen oxyCODONE-acetaminophen (ROXICET) 5-325 MG tablet Take 1-2 tablets by mouth every 4 (four) hours as needed. 60 tablet 0  . polyethylene glycol (MIRALAX / GLYCOLAX) packet Take 17 g by mouth daily. 14 each 0  . sulfamethoxazole-trimethoprim (BACTRIM DS,SEPTRA DS) 800-160 MG tablet Take 1 tablet by mouth 2 (two) times daily. 20 tablet 0  . VITAMIN A PO Take 1 tablet by mouth daily.    . vitamin E 400 UNIT capsule Take 400 Units by mouth daily.     No current facility-administered medications for this visit.        Allergies  Allergen Reactions  . [facility]) Allergy Skin Test Rash  . Pine Rash    Review of Systems:  Constitutional: Denies fever, chills, diaphoresis, appetite change and has fatigue.  HEENT: Denies photophobia, eye pain, redness, hearing loss, ear pain, congestion, sore throat, rhinorrhea, sneezing, mouth sores, neck pain, neck stiffness and tinnitus.   Respiratory: Has SOB, DOE, cough, chest tightness,   and wheezing.   Cardiovascular: Denies chest pain, palpitations and leg swelling.  Genitourinary: Denies dysuria, urgency, frequency, hematuria, flank pain and difficulty urinating.  Musculoskeletal: Denies myalgias, back pain, joint swelling, arthralgias and gait problem.  Skin: No rash.  Neurological: Denies dizziness, seizures, syncope, weakness, light-headedness, numbness and headaches.  Hematological: Denies adenopathy. Easy bruising, personal or family bleeding history  Psychiatric/Behavioral: Has anxiety or depression     Physical Exam:    BP 136/88   Pulse (!) 107   Ht 5' (1.524 m)   Wt 194 lb 2 oz (88.1 kg)   BMI 37.91 kg/m     Wt Readings from Last 3 Encounters:  07/22/20 194 lb 2 oz (88.1 kg)  03/11/20 200 lb 6.4 oz (  90.9 kg)  04/20/16 183 lb 1 oz (83 kg)   Constitutional:  Well-developed, in no acute distress. Psychiatric: Normal mood and affect. Behavior is normal. HEENT: Pupils normal.  Conjunctivae are normal. No scleral icterus. Cardiovascular: Normal rate, regular rhythm. No edema Pulmonary/chest: Bilateral decreased breath sounds.  No wheezing, rales or rhonchi. Abdominal: Soft, nondistended. Nontender. Bowel sounds active throughout. There are no masses palpable. No hepatomegaly.  2 cm umbilical hernia.  Nontender.  Well-healed surgical scar. Rectal: Deferred Neurological: Alert and oriented to person place and time. Skin: Skin is warm and dry. No rashes noted. Extremities 1+ edema.  Data Reviewed: I have personally reviewed following labs and imaging studies  CBC: CBC Latest Ref Rng & Units 04/22/2016 04/21/2016 03/30/2016  WBC 4.0 - 10.5 K/uL 10.0 12.2(H) 13.7(H)  Hemoglobin 12.0 - 15.0 g/dL 10.4(L) 11.3(L) 13.2  Hematocrit 36.0 - 46.0 % 32.3(L) 35.6(L) 40.1  Platelets 150 - 400 K/uL 369 409(H) 308    CMP: CMP Latest Ref Rng & Units 04/21/2016 03/30/2016 03/29/2016  Glucose 65 - 99 mg/dL 186(H) - 155(H)  BUN 6 - 20 mg/dL 16 - 16  Creatinine  0.44 - 1.00 mg/dL 0.73 - 0.57  Sodium 135 - 145 mmol/L 131(L) - 136  Potassium 3.5 - 5.1 mmol/L 4.5 - 4.0  Chloride 101 - 111 mmol/L 98(L) - 101  CO2 22 - 32 mmol/L 26 - 26  Calcium 8.9 - 10.3 mg/dL 8.3(L) - 9.0  Total Protein 6.5 - 8.1 g/dL - 6.5 -  Total Bilirubin 0.3 - 1.2 mg/dL - 0.3 -  Alkaline Phos 38 - 126 U/L - 56 -  AST 15 - 41 U/L - 21 -  ALT 14 - 54 U/L - 16 -      Carmell Austria, M

## 2020-09-20 NOTE — Telephone Encounter (Signed)
Had colon today Elevated CEA H/O colon Ca  Plan: -CT AP with PO and IV contrast. Routine -May need to check BUN/Cr same day of CT. previous 07/2020- Nl -She has transportation problems.   Thx  RG

## 2020-09-20 NOTE — Op Note (Signed)
[facility] Patient Name: Danielle Sullivan Procedure Date: 09/20/2020 MRN: [id number] Attending MD: Jackquline Denmark , MD Date of Birth: 10-27-1955 CSN: [id number] Age: 65 Admit Type: Outpatient Procedure:                Colonoscopy Indications:              #1. Chronic constipation with LLQ pain                           #2. H/O colon cancer (stage I, T2N0M0) 02/2011 s/p R                            hemicolectomy. Subsequent colon 01/2016 showed                            colonic polyp s/p polypectomy, mild diverticulosis.                            Elevated CEA level Providers:                Jackquline Denmark, MD, Carmie End, RN, [place]                            Person, Tyrone Apple, Technician, [facility], CRNA Referring MD:              Medicines:                Monitored Anesthesia Care Complications:            No immediate complications. Estimated Blood Loss:     Estimated blood loss: none. Estimated blood loss:                            none. Procedure:                Pre-Anesthesia Assessment:                           - Prior to the procedure, a History and Physical                            was performed, and patient medications and                            allergies were reviewed. The patient's tolerance of                            previous anesthesia was also reviewed. The risks                            and benefits of the procedure and the sedation                            options and risks were discussed with the patient.  All questions were answered, and informed consent                            was obtained. Prior Anticoagulants: The patient has                            taken no previous anticoagulant or antiplatelet                            agents. ASA Grade Assessment: III - A patient with                            severe systemic disease. After reviewing the risks                             and benefits, the patient was deemed in                            satisfactory condition to undergo the procedure.                           After obtaining informed consent, the colonoscope                            was passed under direct vision. Throughout the                            procedure, the patient's blood pressure, pulse, and                            oxygen saturations were monitored continuously. The                            PCF-H190DL ([id number]) Olympus pediatric colonscope                            was introduced through the anus and advanced to the                            the ileocolonic anastomosis. The colonoscopy was                            performed without difficulty. The patient tolerated                            the procedure well. The quality of the bowel                            preparation was adequate to identify polyps 6 mm                            and larger in size. There was retained solid  vegetable material and stool in several areas of                            the colon making it somewhat harder to visualize.                            Aggressive suctioning and aspiration was performed.                            Overall over 85-90% of colonic mucosa was                            visualized satisfactorily. The ileocolic                            anastomosis and rectum were photographed. Scope In: 9:20:59 AM Scope Out: 9:38:48 AM Scope Withdrawal Time: 0 hours 13 minutes 46 seconds  Total Procedure Duration: 0 hours 17 minutes 49 seconds  Findings:      A 6 mm polyp was found in the rectum. The polyp was sessile. The polyp       was removed with a cold snare. Resection and retrieval were complete.      A few small-mouthed diverticula were found in the sigmoid colon.      Non-bleeding internal hemorrhoids were found during retroflexion. The       hemorrhoids were small.      The exam was  otherwise to ileocolic anastomosis. Impression:               - One 6 mm polyp in the rectum, removed with a cold                            snare. Resected and retrieved.                           - Minimal sigmoid diverticulosis.                           - Non-bleeding internal hemorrhoids.                           - Otherwise grossly normal to anastomosis.                           - S/P right hemicolectomy with ileocolonic                            anastomosis. Moderate Sedation:      Not Applicable - Patient had care per Anesthesia. Recommendation:           - Discharge patient to home.                           - Resume previous diet.                           - Continue present medications.                           -  Await pathology results.                           - Repeat colonoscopy for surveillance based on                            pathology results.                           - Would recommend CT Abdo/pelvis with p.o. and IV                            contrast RE: elevated CEA.                           - The findings and recommendations were discussed                            with the patient. Procedure Code(s):        --- Professional ---                           [phone], Colonoscopy, flexible; with removal of                            tumor(s), polyp(s), or other lesion(s) by snare                            technique Diagnosis Code(s):        --- Professional ---                           Z86.010, Personal history of colonic polyps                           Z85.038, Personal history of other malignant                            neoplasm of large intestine                           K62.1, Rectal polyp                           K64.8, Other hemorrhoids                           K57.30, Diverticulosis of large intestine without                            perforation or abscess without bleeding CPT copyright 2019 [employer]. All rights  reserved. The codes documented in this report are preliminary and upon coder review may  be revised to meet current compliance requirements. Jackquline Denmark, MD 09/20/2020 9:49:27 AM This report has been signed electronically. Number of Addenda: 0

## 2020-09-20 NOTE — Discharge Instructions (Signed)

## 2020-09-21 ENCOUNTER — Encounter (HOSPITAL_COMMUNITY): Payer: Self-pay | Admitting: Gastroenterology

## 2020-09-21 LAB — SURGICAL PATHOLOGY

## 2020-09-30 ENCOUNTER — Encounter: Payer: Self-pay | Admitting: Gastroenterology

## 2020-09-30 NOTE — Progress Notes (Signed)
Please make sure CT Abdo/pelvis with p.o. and IV contrast has been scheduled RE: elevated CEA level, history of colon cancer

## 2020-10-03 ENCOUNTER — Other Ambulatory Visit: Payer: Self-pay

## 2020-10-03 DIAGNOSIS — Z85038 Personal history of other malignant neoplasm of large intestine: Secondary | ICD-10-CM

## 2020-10-03 DIAGNOSIS — R970 Elevated carcinoembryonic antigen [CEA]: Secondary | ICD-10-CM

## 2020-10-26 ENCOUNTER — Ambulatory Visit: Payer: Medicaid Other
